# Patient Record
Sex: Female | Born: 1975 | Race: White | Hispanic: No | Marital: Married | Smoking: Never smoker
Health system: Southern US, Community
[De-identification: ages and names within clinical notes are randomized; demographics above are authoritative.]

## PROBLEM LIST (undated history)

## (undated) DIAGNOSIS — O139 Gestational [pregnancy-induced] hypertension without significant proteinuria, unspecified trimester: Secondary | ICD-10-CM

## (undated) DIAGNOSIS — D62 Acute posthemorrhagic anemia: Secondary | ICD-10-CM

## (undated) DIAGNOSIS — F53 Postpartum depression: Secondary | ICD-10-CM

## (undated) DIAGNOSIS — Z98891 History of uterine scar from previous surgery: Secondary | ICD-10-CM

## (undated) DIAGNOSIS — O99345 Other mental disorders complicating the puerperium: Secondary | ICD-10-CM

## (undated) DIAGNOSIS — F329 Major depressive disorder, single episode, unspecified: Secondary | ICD-10-CM

## (undated) DIAGNOSIS — F32A Depression, unspecified: Secondary | ICD-10-CM

## (undated) DIAGNOSIS — F419 Anxiety disorder, unspecified: Secondary | ICD-10-CM

## (undated) HISTORY — PX: LASIK: SHX215

---

## 2002-01-29 ENCOUNTER — Other Ambulatory Visit: Admission: RE | Admit: 2002-01-29 | Discharge: 2002-01-29 | Payer: Self-pay | Admitting: Gynecology

## 2003-02-05 ENCOUNTER — Other Ambulatory Visit: Admission: RE | Admit: 2003-02-05 | Discharge: 2003-02-05 | Payer: Self-pay | Admitting: Gynecology

## 2004-02-15 ENCOUNTER — Other Ambulatory Visit: Admission: RE | Admit: 2004-02-15 | Discharge: 2004-02-15 | Payer: Self-pay | Admitting: Gynecology

## 2004-07-29 ENCOUNTER — Ambulatory Visit: Payer: Self-pay | Admitting: Family Medicine

## 2005-02-15 ENCOUNTER — Other Ambulatory Visit: Admission: RE | Admit: 2005-02-15 | Discharge: 2005-02-15 | Payer: Self-pay | Admitting: Gynecology

## 2006-02-16 ENCOUNTER — Other Ambulatory Visit: Admission: RE | Admit: 2006-02-16 | Discharge: 2006-02-16 | Payer: Self-pay | Admitting: Obstetrics and Gynecology

## 2006-10-03 DIAGNOSIS — F329 Major depressive disorder, single episode, unspecified: Secondary | ICD-10-CM | POA: Insufficient documentation

## 2007-02-22 ENCOUNTER — Other Ambulatory Visit: Admission: RE | Admit: 2007-02-22 | Discharge: 2007-02-22 | Payer: Self-pay | Admitting: Obstetrics and Gynecology

## 2007-07-09 ENCOUNTER — Ambulatory Visit: Payer: Self-pay | Admitting: Family Medicine

## 2007-07-09 DIAGNOSIS — J029 Acute pharyngitis, unspecified: Secondary | ICD-10-CM | POA: Insufficient documentation

## 2007-07-09 LAB — CONVERTED CEMR LAB: Rapid Strep: NEGATIVE

## 2008-05-26 ENCOUNTER — Ambulatory Visit (HOSPITAL_COMMUNITY): Admission: RE | Admit: 2008-05-26 | Discharge: 2008-05-26 | Payer: Self-pay | Admitting: Obstetrics

## 2008-07-07 ENCOUNTER — Ambulatory Visit (HOSPITAL_COMMUNITY): Admission: RE | Admit: 2008-07-07 | Discharge: 2008-07-07 | Payer: Self-pay | Admitting: Obstetrics

## 2008-08-25 ENCOUNTER — Ambulatory Visit (HOSPITAL_COMMUNITY): Admission: RE | Admit: 2008-08-25 | Discharge: 2008-08-25 | Payer: Self-pay | Admitting: Obstetrics

## 2008-10-07 ENCOUNTER — Ambulatory Visit: Payer: Self-pay | Admitting: Obstetrics & Gynecology

## 2008-10-07 ENCOUNTER — Inpatient Hospital Stay (HOSPITAL_COMMUNITY): Admission: AD | Admit: 2008-10-07 | Discharge: 2008-10-11 | Payer: Self-pay | Admitting: Obstetrics

## 2009-06-20 IMAGING — US US OB DETAIL+14 WK
1 series · 18 of 28 positions shown · non-contrast
Comparison: none

OBSTETRICAL ULTRASOUND:
 This ultrasound was performed in The [HOSPITAL], and the [facility] report will be stored to [REDACTED] PACS.

[Series 1: us ob detail+14 wk · 112 acquisitions, 18 frames shown]
[im 1/112]
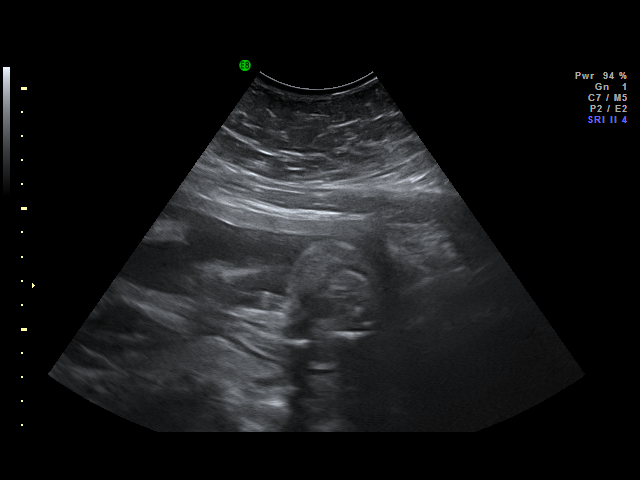
[im 9/112]
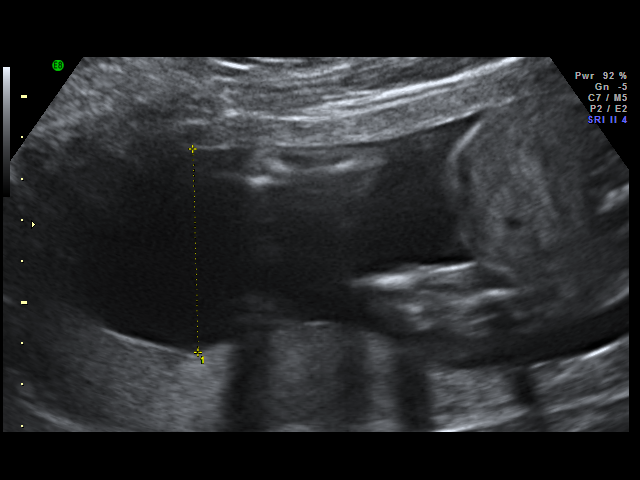
[im 13/112]
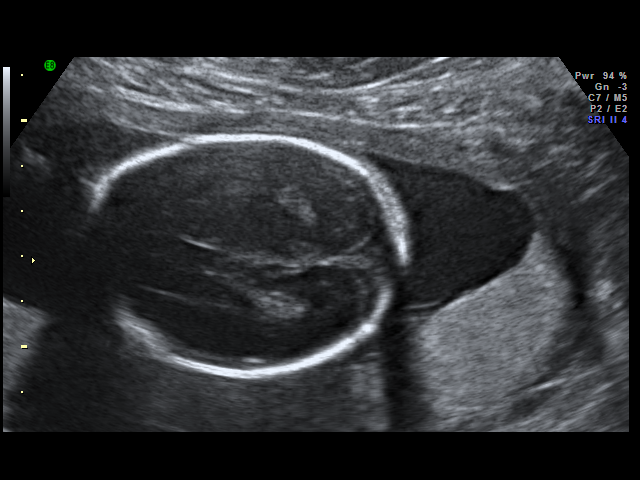
[im 21/112]
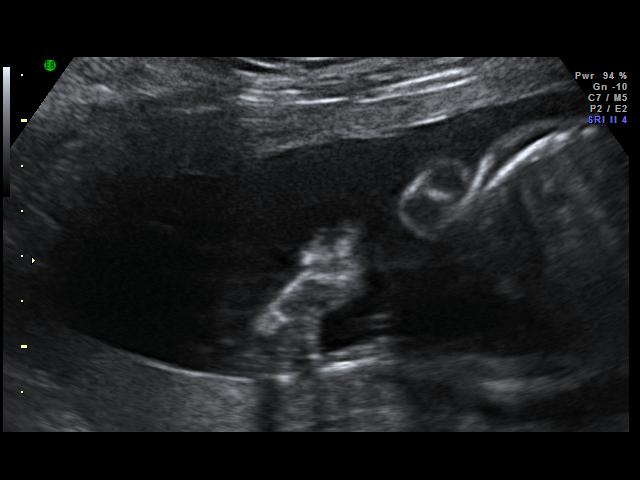
[im 29/112]
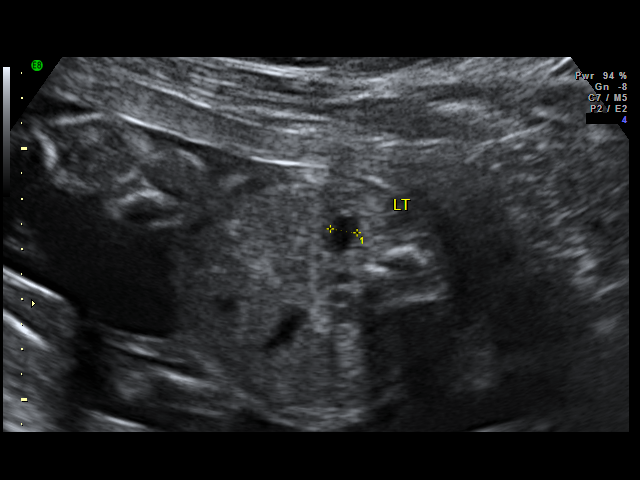
[im 33/112]
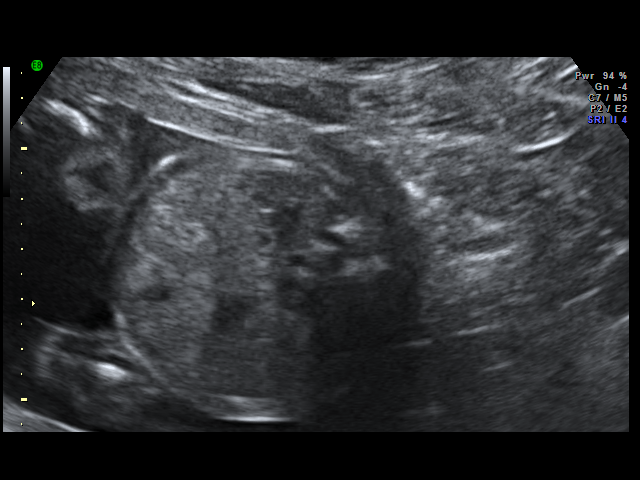
[im 42/112]
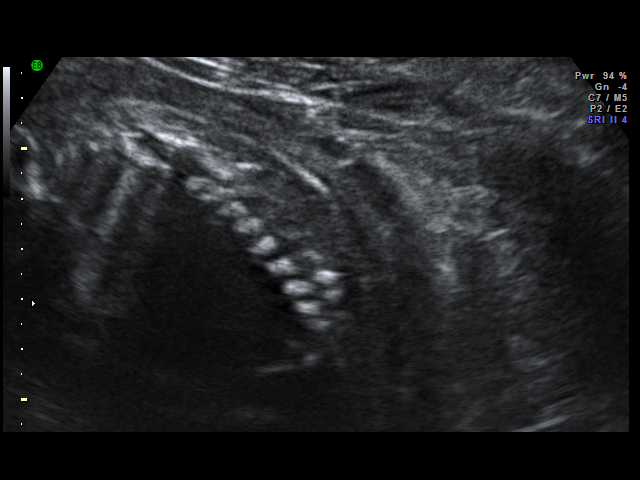
[im 46/112]
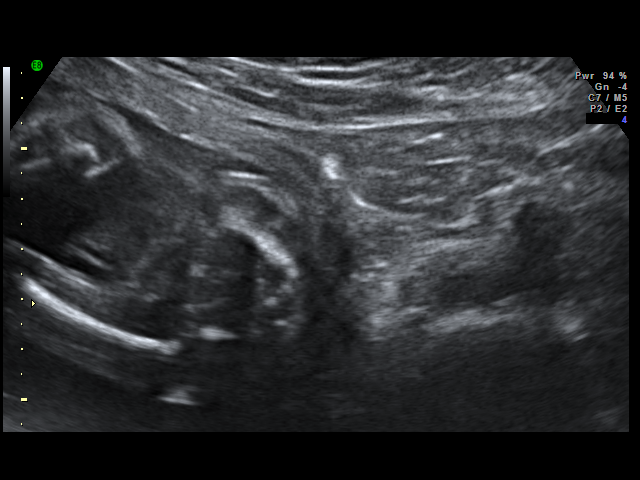
[im 54/112]
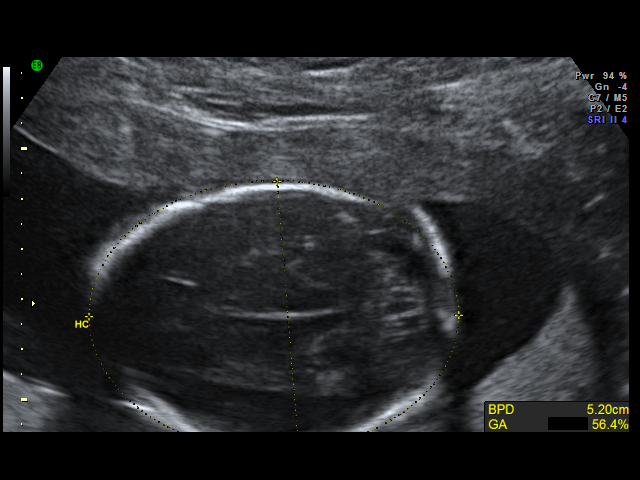
[im 58/112]
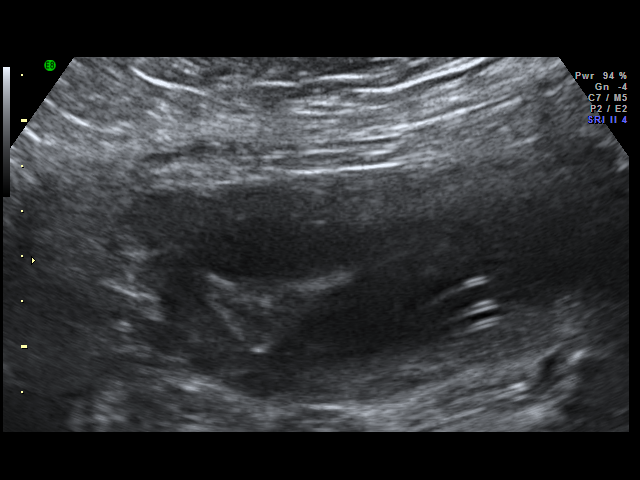
[im 66/112]
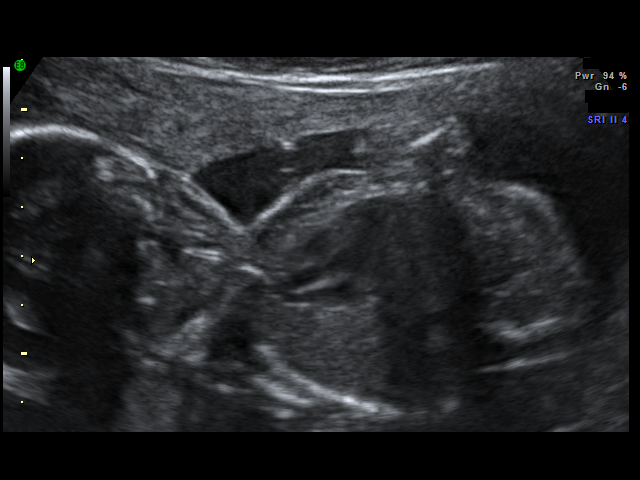
[im 70/112]
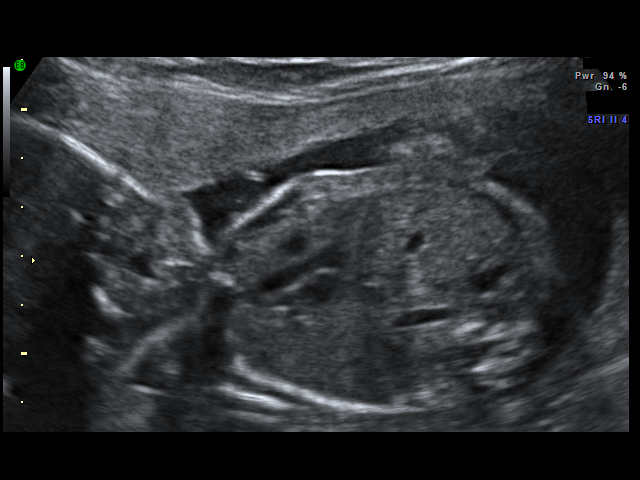
[im 79/112]
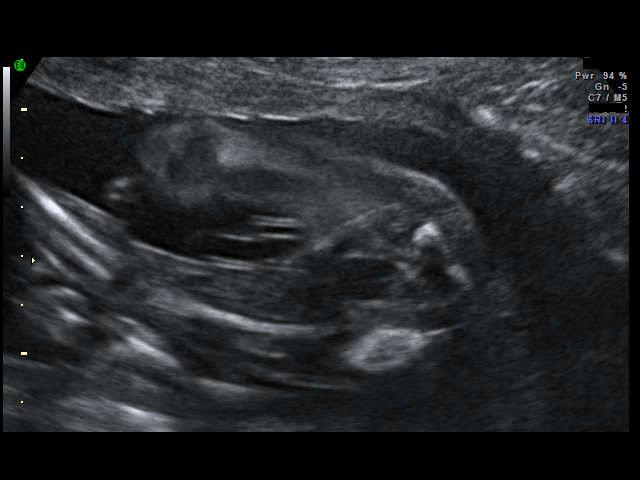
[im 87/112]
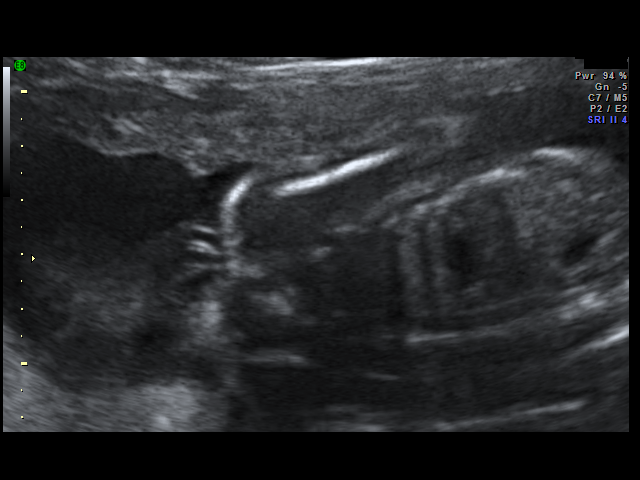
[im 91/112]
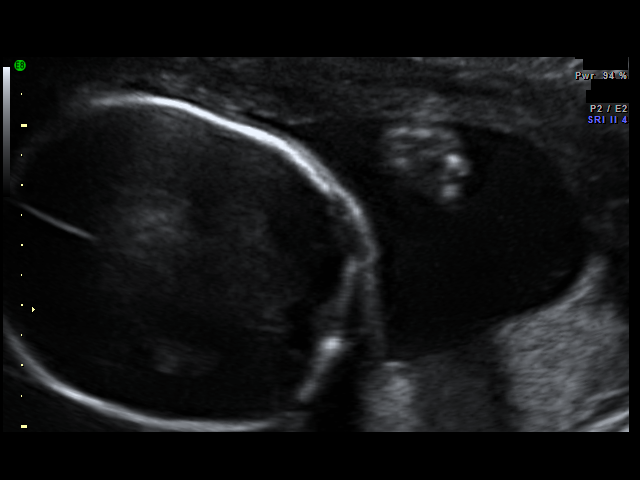
[im 99/112]
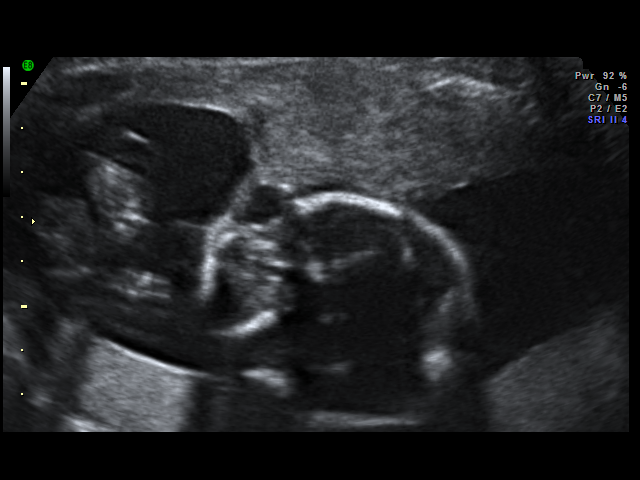
[im 103/112]
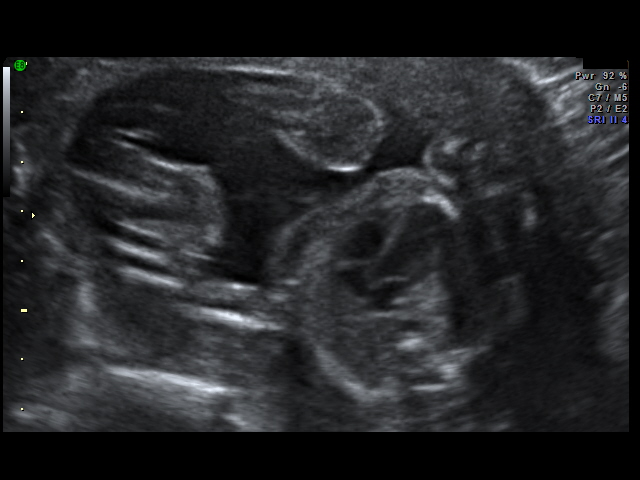
[im 112/112]
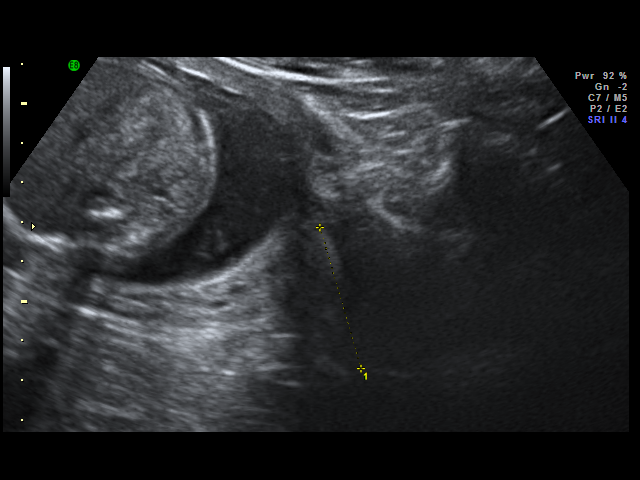

[18 of 28 positions shown; findings below may reference images not displayed]

IMPRESSION: [facility] has also been faxed to the ordering physician.

## 2009-09-19 IMAGING — US US OB FOLLOW-UP
1 series · 18 of 28 positions shown · non-contrast
Comparison: none

OBSTETRICAL ULTRASOUND:
 This ultrasound was performed in The [HOSPITAL], and the [facility] report will be stored to [REDACTED] PACS.

[Series 1: us ob follow-up · 37 acquisitions, 18 frames shown]
[im 1/37]
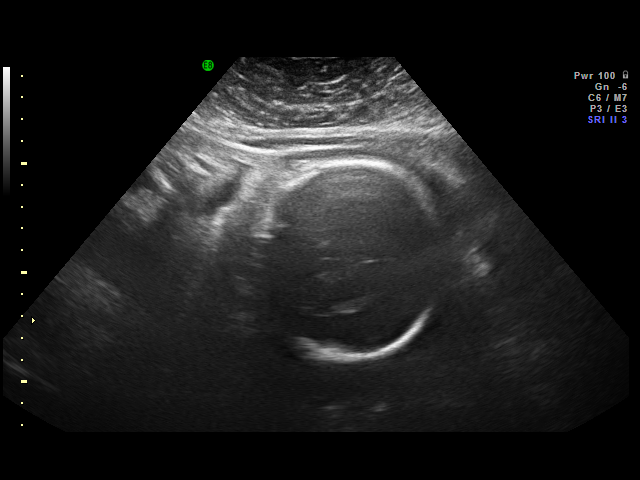
[im 3/37]
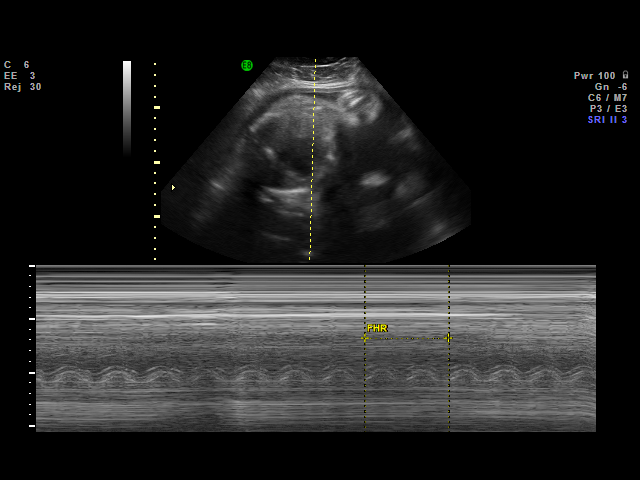
[im 5/37]
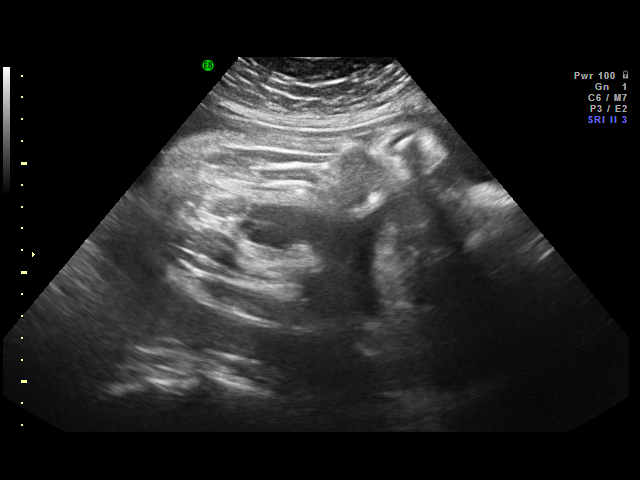
[im 7/37]
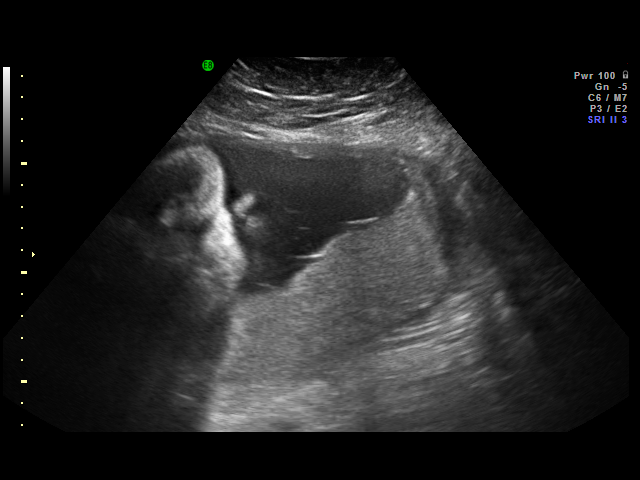
[im 10/37]
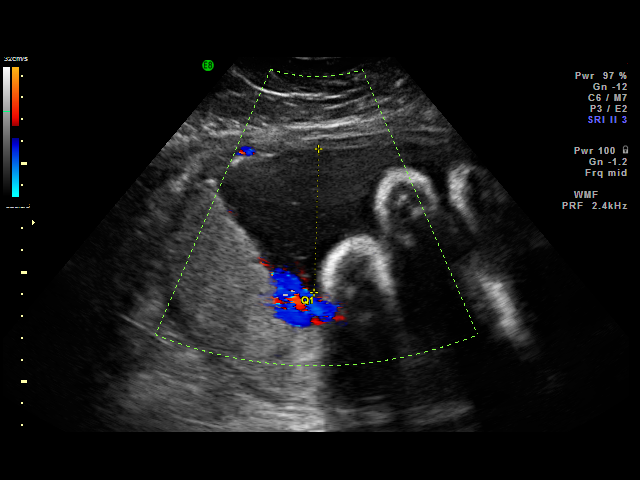
[im 11/37]
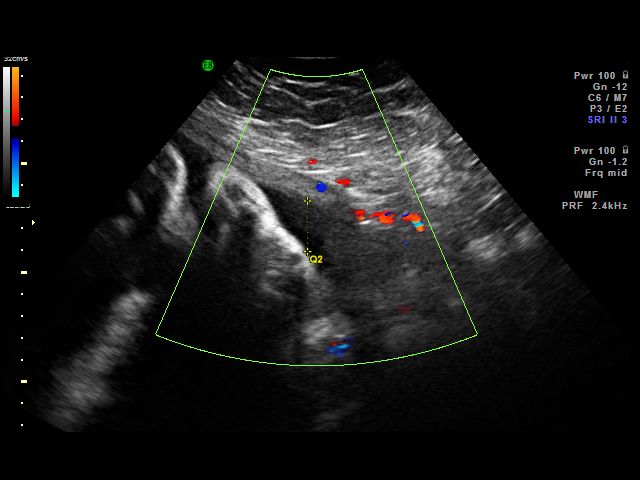
[im 14/37]
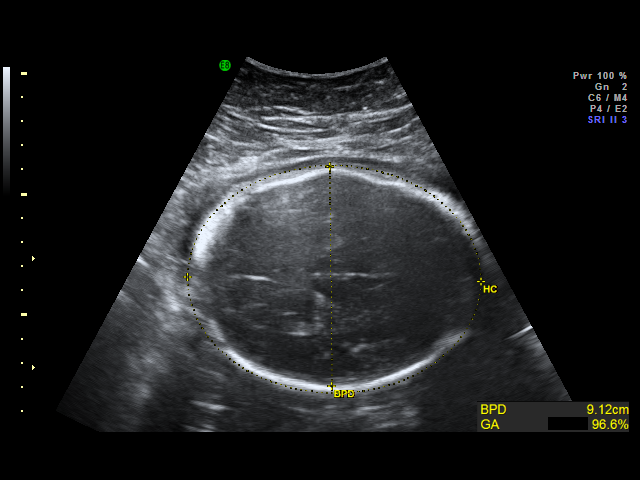
[im 15/37]
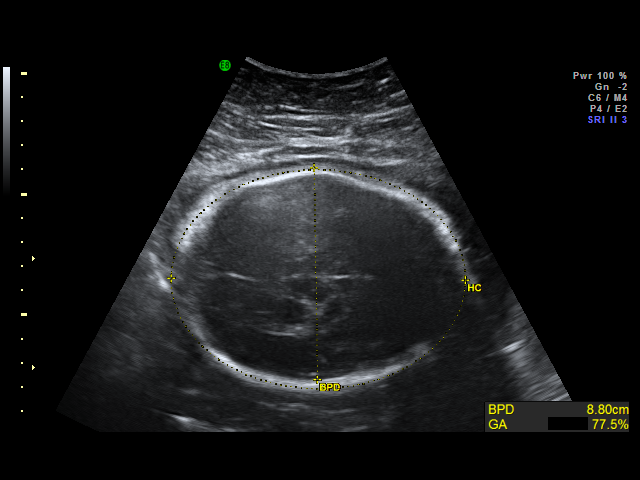
[im 18/37]
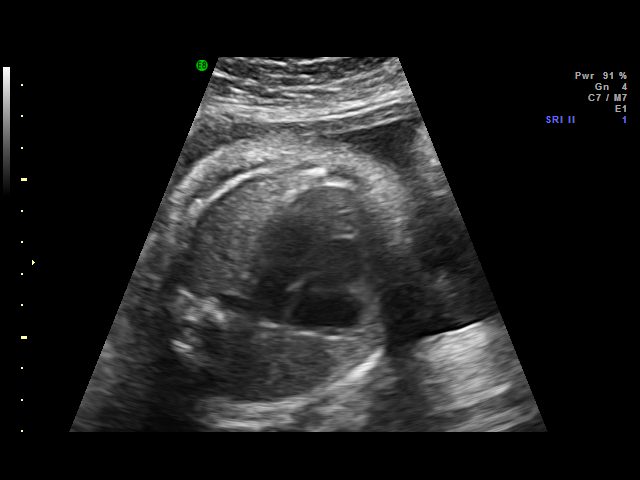
[im 19/37]
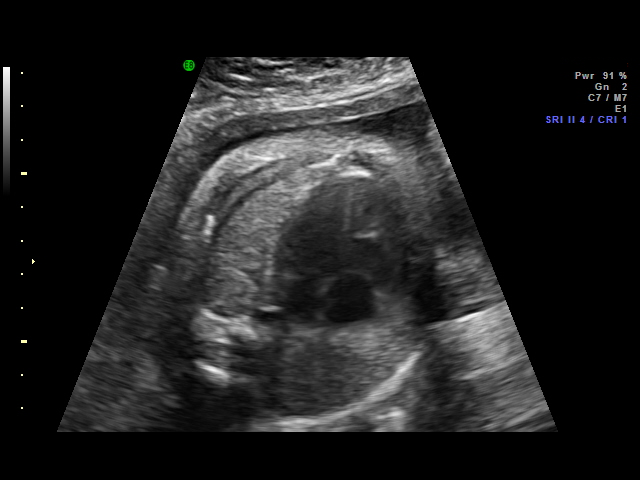
[im 22/37]
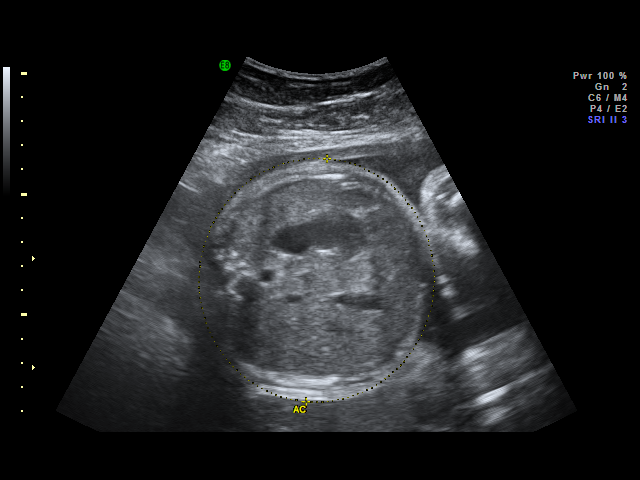
[im 23/37]
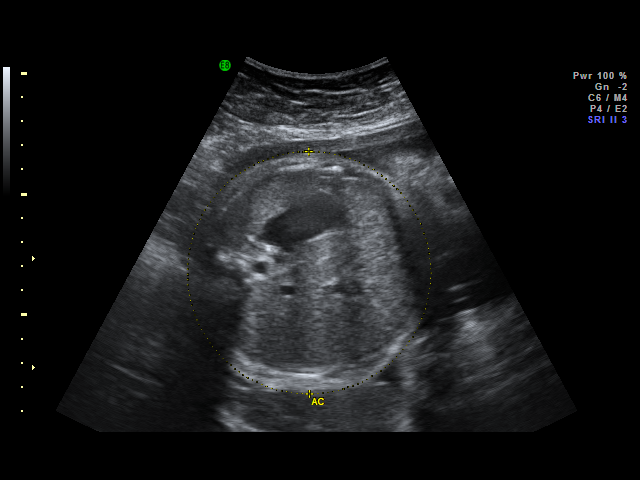
[im 26/37]
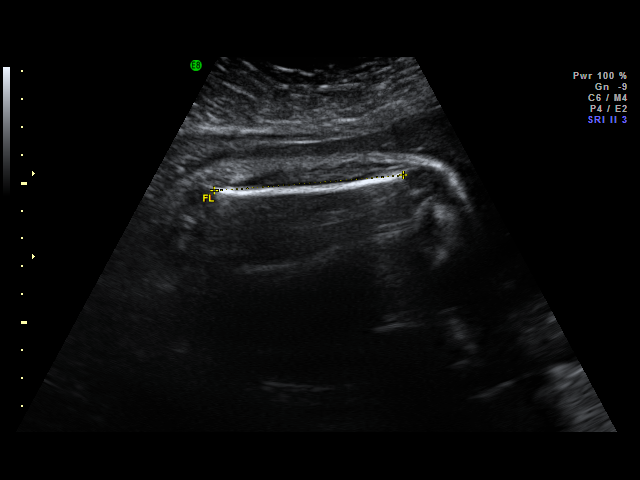
[im 29/37]
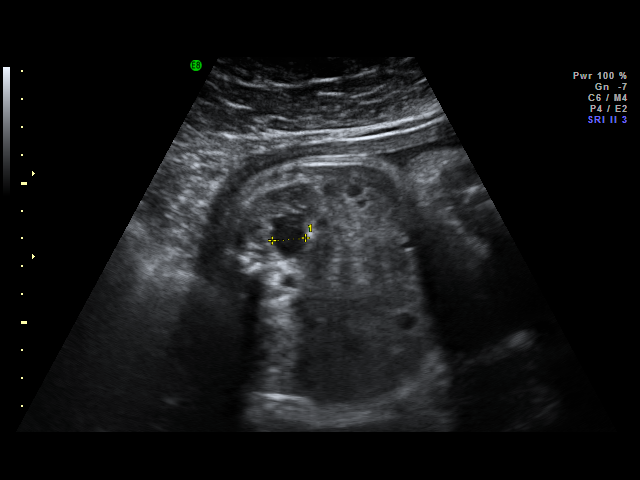
[im 30/37]
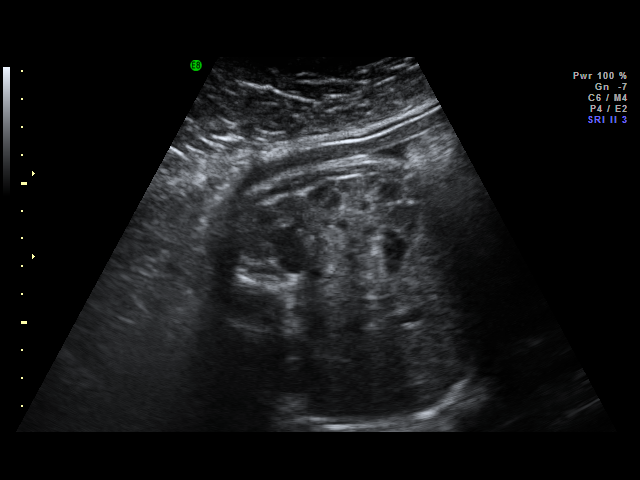
[im 33/37]
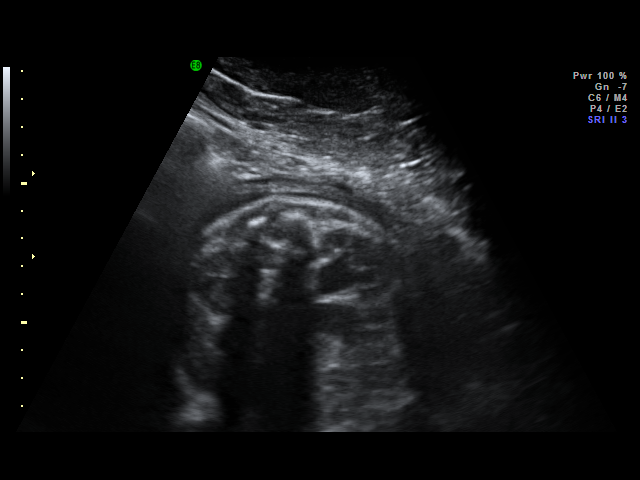
[im 34/37]
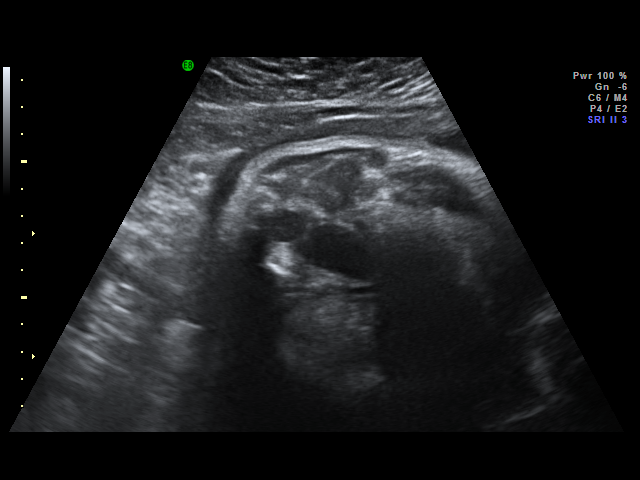
[im 37/37]
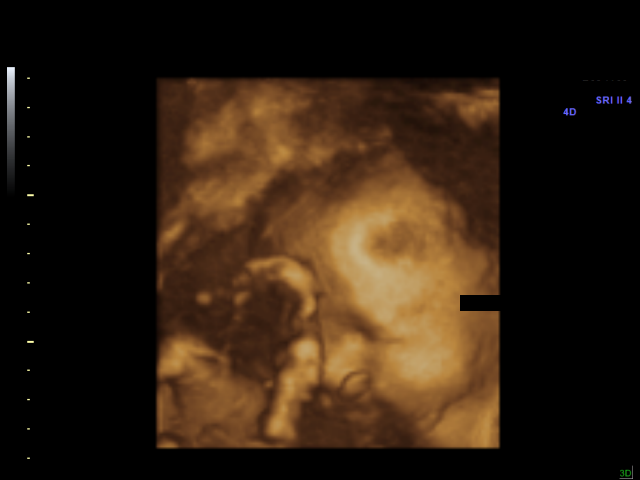

[18 of 28 positions shown; findings below may reference images not displayed]

IMPRESSION: [facility] has also been faxed to the ordering physician.

## 2009-12-14 ENCOUNTER — Ambulatory Visit: Payer: Self-pay | Admitting: Family Medicine

## 2009-12-14 DIAGNOSIS — J45991 Cough variant asthma: Secondary | ICD-10-CM | POA: Insufficient documentation

## 2009-12-31 ENCOUNTER — Ambulatory Visit: Payer: Self-pay | Admitting: Family Medicine

## 2009-12-31 DIAGNOSIS — K219 Gastro-esophageal reflux disease without esophagitis: Secondary | ICD-10-CM | POA: Insufficient documentation

## 2010-01-06 ENCOUNTER — Ambulatory Visit: Payer: Self-pay | Admitting: Family Medicine

## 2010-02-14 ENCOUNTER — Encounter: Payer: Self-pay | Admitting: Obstetrics

## 2010-02-22 NOTE — Assessment & Plan Note (Signed)
Summary: COUGH//SLM   Vital Signs:  Patient profile:   35 year old female Weight:      231 pounds Temp:     98.3 degrees F oral BP sitting:   130 / 90  (left arm) Cuff size:   regular  Vitals Entered By: Kern Reap CMA Duncan Dull) (December 14, 2009 12:19 PM) CC: cough   CC:  cough.  History of Present Illness: [name] [name] is a 35 year old, married female, nonsmoker, who comes in today with a 6 week history of coughing.  She does have a history of very mild allergic rhinitis.  No history of asthma in the past.  No new environmental triggers.  She has a 33-month-old child who currently is on albuterol and steroids.  Because of asthma  Allergies: No Known Drug Allergies  Family History: Reviewed history from 10/03/2006 and no changes required. Family History High cholesterol Family History Hypertension Family History Weight disorder Family History of Arthritis Family History Lung cancer Family History Ovarian cancer Family History Psychiatric care Family History of Stroke M 1st degree relative <50 Family History Uterine cancer Family History of Cardiovascular disorder  Social History: Reviewed history from 10/03/2006 and no changes required. Occupation: Married Never Smoked Alcohol use-yes Drug use-no Regular exercise-no  Review of Systems      See HPI  Physical Exam  General:  Well-developed,well-nourished,in no acute distress; alert,appropriate and cooperative throughout examination Head:  Normocephalic and atraumatic without obvious abnormalities. No apparent alopecia or balding. Eyes:  No corneal or conjunctival inflammation noted. EOMI. Perrla. Funduscopic exam benign, without hemorrhages, exudates or papilledema. Vision grossly normal. Ears:  External ear exam shows no significant lesions or deformities.  Otoscopic examination reveals clear canals, tympanic membranes are intact bilaterally without bulging, retraction, inflammation or discharge. Hearing is grossly  normal bilaterally. Nose:  External nasal examination shows no deformity or inflammation. Nasal mucosa are pink and moist without lesions or exudates. Mouth:  Oral mucosa and oropharynx without lesions or exudates.  Teeth in good repair. Neck:  No deformities, masses, or tenderness noted. Chest Wall:  No deformities, masses, or tenderness noted. Lungs:  symmetrical breath sounds  expiratory wheezing on forced expiration   Problems:  Medical Problems Added: 1)  Dx of Asthma, Cough Variant  (ICD-493.82)  Impression & Recommendations:  Problem # 1:  ASTHMA, COUGH VARIANT (JYN-829.56) Assessment New  Her updated medication list for this problem includes:    Prednisone 20 Mg Tabs (Prednisone) ..... Uad  Complete Medication List: 1)  Yasmin 28 3-0.03 Mg Tabs (Drospirenone-ethinyl estradiol) 2)  Celexa 20 Mg Tabs (Citalopram hydrobromide) 3)  Hydromet 5-1.5 Mg/23ml Syrp (Hydrocodone-homatropine) .Marland Kitchen.. 1 or 2 tspsqhs as needed 4)  Prednisone 20 Mg Tabs (Prednisone) .... Uad 5)  Hydromet 5-1.5 Mg/71ml Syrp (Hydrocodone-homatropine) .... 1/2 to 1 tsp  qhs  as needed  Patient Instructions: 1)  drink 30 ounces of water daily. 2)  Vaporizer in her bedroom at night. 3)  Prednisone two tabs x 3 days, one x 3 days, a half x 3 days, then half a tablet Monday, Wednesday, Friday, for a two week taper. 4)  Hydromet one half to 1 teaspoon at bedtime as needed for nighttime cough. 5)  Return p.r.n. 6)  While on the prednisone stay on a complete salt free diet Prescriptions: HYDROMET 5-1.5 MG/5ML SYRP (HYDROCODONE-HOMATROPINE) 1/2 to 1 tsp  qhs  as needed  #4oz x 1   Entered and Authorized by:   Roderick Pee MD   Signed by:   Tinnie Gens  Shawnie Dapper MD on 12/14/2009   Method used:   Print then Give to Patient   RxID:   [id number] PREDNISONE 20 MG TABS (PREDNISONE) UAD  #30 x 1   Entered and Authorized by:   Roderick Pee MD   Signed by:   Roderick Pee MD on 12/14/2009   Method used:   Print then  Give to Patient   RxID:   [id number]    Orders Added: 1)  Est. Patient Level IV [28413]

## 2010-02-22 NOTE — Assessment & Plan Note (Signed)
Summary: continues with cough/dm   Vital Signs:  Patient profile:   35 year old female Height:      67 inches Weight:      233 pounds BMI:     36.62 Temp:     98.4 degrees F oral BP sitting:   120 / 80  (left arm) Cuff size:   regular  Vitals Entered By: Kern Reap CMA Duncan Dull) (December 31, 2009 1:16 PM) CC: continued cough and rash under breast   CC:  continued cough and rash under breast.  History of Present Illness: [name] [name] is a 35 year old, married female, nonsmoker, who comes in today with a persistent cough.  We saw HER-2 weeks ago for evaluation of this problem.  Exam was negative, except she was wheezing.  We start her on a short course of prednisone 40 mg a day for 3 days with a taper.  However, she took 40 mg a day for 8 days and then begin a taper.  She is now down to one tablet a day.  However, she still coughing and wheezing.  No fever no sputum production.  Review of systems negative except as discussed symptoms of reflux esophagitis with burning in her upper esophagus.  She also states on occasion.  She feels like food or pills get stuck in her upper esophagus.  Current Medications (verified): 1)  Yasmin 28 3-0.03 Mg  Tabs (Drospirenone-Ethinyl Estradiol) 2)  Celexa 20 Mg  Tabs (Citalopram Hydrobromide) 3)  Prednisone 20 Mg Tabs (Prednisone) .... Uad 4)  Hydromet 5-1.5 Mg/29ml Syrp (Hydrocodone-Homatropine) .... 1/2 To 1 Tsp  Qhs  As Needed  Allergies (verified): No Known Drug Allergies  Past History:  Past medical, surgical, family and social histories (including risk factors) reviewed, and no changes noted (except as noted below).  Past Medical History: Reviewed history from 10/03/2006 and no changes required. Depression  Past Surgical History: Reviewed history from 10/03/2006 and no changes required. Denies surgical history  Family History: Reviewed history from 10/03/2006 and no changes required. Family History High cholesterol Family History  Hypertension Family History Weight disorder Family History of Arthritis Family History Lung cancer Family History Ovarian cancer Family History Psychiatric care Family History of Stroke M 1st degree relative <50 Family History Uterine cancer Family History of Cardiovascular disorder  Social History: Reviewed history from 10/03/2006 and no changes required. Occupation: Married Never Smoked Alcohol use-yes Drug use-no Regular exercise-no  Review of Systems      See HPI  Physical Exam  General:  Well-developed,well-nourished,in no acute distress; alert,appropriate and cooperative throughout examination Head:  Normocephalic and atraumatic without obvious abnormalities. No apparent alopecia or balding. Eyes:  No corneal or conjunctival inflammation noted. EOMI. Perrla. Funduscopic exam benign, without hemorrhages, exudates or papilledema. Vision grossly normal. Ears:  External ear exam shows no significant lesions or deformities.  Otoscopic examination reveals clear canals, tympanic membranes are intact bilaterally without bulging, retraction, inflammation or discharge. Hearing is grossly normal bilaterally. Nose:  External nasal examination shows no deformity or inflammation. Nasal mucosa are pink and moist without lesions or exudates. Mouth:  Oral mucosa and oropharynx without lesions or exudates.  Teeth in good repair. Neck:  No deformities, masses, or tenderness noted. Chest Wall:  No deformities, masses, or tenderness noted. Lungs:  symmetrical breath sounds bilateral wheezing   Problems:  Medical Problems Added: 1)  Dx of Gerd  (ICD-530.81)  Impression & Recommendations:  Problem # 1:  GERD (ICD-530.81) Assessment New  Problem # 2:  ASTHMA, COUGH  VARIANT (ICD-493.82) Assessment: Unchanged  Her updated medication list for this problem includes:    Prednisone 20 Mg Tabs (Prednisone) ..... Uad    Proair Hfa 108 (90 Base) Mcg/act Aers (Albuterol sulfate) .Marland Kitchen... 2 puffs 3  x day  Complete Medication List: 1)  Yasmin 28 3-0.03 Mg Tabs (Drospirenone-ethinyl estradiol) 2)  Celexa 20 Mg Tabs (Citalopram hydrobromide) 3)  Prednisone 20 Mg Tabs (Prednisone) .... Uad 4)  Hydromet 5-1.5 Mg/39ml Syrp (Hydrocodone-homatropine) .... 1/2 to 1 tsp  qhs  as needed 5)  Doxycycline Hyclate 100 Mg Caps (Doxycycline hyclate) .... Take 1 tablet by mouth two times a day 6)  Proair Hfa 108 (90 Base) Mcg/act Aers (Albuterol sulfate) .... 2 puffs 3 x day  Patient Instructions: 1)  take doxycycline 100 mg twice daily. 2)  Drink 30 ounces of water daily. 3)  Try taking half of a teaspoon of Hydromet 3 times a day. 4)  Get OTC Prilosec 20 mg and take one twice daily, also, nothing to eat or drink for 3 hours before you go to bed at night......... and avoid caffeine, and peppermint. 5)  Prednisone two tabs daily. 6)  Albuterol 2 puffs ,,,,,,,,,, 3 times a day 7)  remember the environmental things.  We discussed 8)  Return next Thursday for follow-up Prescriptions: PROAIR HFA 108 (90 BASE) MCG/ACT AERS (ALBUTEROL SULFATE) 2 puffs 3 x day  #1 x 1   Entered and Authorized by:   Roderick Pee MD   Signed by:   Roderick Pee MD on 12/31/2009   Method used:   Print then Give to Patient   RxID:   [id number] HYDROMET 5-1.5 MG/5ML SYRP (HYDROCODONE-HOMATROPINE) 1/2 to 1 tsp  qhs  as needed  #8oz x 1   Entered and Authorized by:   Roderick Pee MD   Signed by:   Roderick Pee MD on 12/31/2009   Method used:   Print then Give to Patient   RxID:   [id number] DOXYCYCLINE HYCLATE 100 MG CAPS (DOXYCYCLINE HYCLATE) Take 1 tablet by mouth two times a day  #20 x 1   Entered and Authorized by:   Roderick Pee MD   Signed by:   Roderick Pee MD on 12/31/2009   Method used:   Print then Give to Patient   RxID:   [id number]    Orders Added: 1)  Est. Patient Level IV [01027]    Prevention & Chronic Care Immunizations   Influenza vaccine: Not documented    Tetanus  booster: Not documented    Pneumococcal vaccine: Not documented  Other Screening   Pap smear: Not documented   Smoking status: never  (10/03/2006)

## 2010-02-24 NOTE — Assessment & Plan Note (Signed)
Summary: 4 DAY FUP PER DR Rayden Scheper//CCM   Vital Signs:  Patient profile:   35 year old female Height:      67 inches Weight:      233 pounds Temp:     98.7 degrees F oral Pulse rate:   118 / minute BP sitting:   120 / 92  (left arm) Cuff size:   large  Vitals Entered By: Kathlene November LPN (January 06, 2010 4:54 PM) CC: follow-up cough. Pt states cough has improved but still present   CC:  follow-up cough. Pt states cough has improved but still present.  History of Present Illness: Billiejean is a 35 year old, married female, nonsmoker, who comes back today for follow-up of reactive airway disease.  She states she feels much better.  Her cough is markedly diminished.  No side effects from medication  Current Medications (verified): 1)  Yasmin 28 3-0.03 Mg  Tabs (Drospirenone-Ethinyl Estradiol) 2)  Celexa 20 Mg  Tabs (Citalopram Hydrobromide) 3)  Prednisone 20 Mg Tabs (Prednisone) .... Uad 4)  Hydromet 5-1.5 Mg/47ml Syrp (Hydrocodone-Homatropine) .... 1/2 To 1 Tsp  Qhs  As Needed 5)  Doxycycline Hyclate 100 Mg Caps (Doxycycline Hyclate) .... Take 1 Tablet By Mouth Two Times A Day 6)  Proair Hfa 108 (90 Base) Mcg/act Aers (Albuterol Sulfate) .... 2 Puffs 3 X Day  Allergies (verified): No Known Drug Allergies  Comments:  Nurse/Medical Assistant: The patient's medications and allergies were reviewed with the patient and were updated in the Medication and Allergy Lists. Kathlene November LPN (January 06, 2010 4:55 PM)  Past History:  Past medical, surgical, family and social histories (including risk factors) reviewed for relevance to current acute and chronic problems.  Past Medical History: Reviewed history from 10/03/2006 and no changes required. Depression  Past Surgical History: Reviewed history from 10/03/2006 and no changes required. Denies surgical history  Family History: Reviewed history from 10/03/2006 and no changes required. Family History High cholesterol Family  History Hypertension Family History Weight disorder Family History of Arthritis Family History Lung cancer Family History Ovarian cancer Family History Psychiatric care Family History of Stroke M 1st degree relative <50 Family History Uterine cancer Family History of Cardiovascular disorder  Social History: Reviewed history from 10/03/2006 and no changes required. Occupation: Married Never Smoked Alcohol use-yes Drug use-no Regular exercise-no  Review of Systems      See HPI  Physical Exam  General:  Well-developed,well-nourished,in no acute distress; alert,appropriate and cooperative throughout examination Heart:  symmetrical breath sounds, very late to minor expiratory wheezing   Impression & Recommendations:  Problem # 1:  ASTHMA, COUGH VARIANT (ICD-493.82)  Her updated medication list for this problem includes:    Prednisone 20 Mg Tabs (Prednisone) ..... Uad    Proair Hfa 108 (90 Base) Mcg/act Aers (Albuterol sulfate) .Marland Kitchen... 2 puffs 3 x day  Complete Medication List: 1)  Yasmin 28 3-0.03 Mg Tabs (Drospirenone-ethinyl estradiol) 2)  Celexa 20 Mg Tabs (Citalopram hydrobromide) 3)  Prednisone 20 Mg Tabs (Prednisone) .... Uad 4)  Hydromet 5-1.5 Mg/41ml Syrp (Hydrocodone-homatropine) .... 1/2 to 1 tsp  qhs  as needed 5)  Doxycycline Hyclate 100 Mg Caps (Doxycycline hyclate) .... Take 1 tablet by mouth two times a day 6)  Proair Hfa 108 (90 Base) Mcg/act Aers (Albuterol sulfate) .... 2 puffs 3 x day  Patient Instructions: 1)  begin to taper the prednisone by taking one tablet daily for 3 days, half a tablet for 3 days, then half a tablet every other day  for two week taper.  Continue to take Hydromet and  at bedtime, and finished the doxycycline.  Two ahead and stop the albuterol.  Return p.r.n.   Orders Added: 1)  Est. Patient Level III [16109]

## 2010-04-29 LAB — CBC
HCT: 25.2 % — ABNORMAL LOW (ref 36.0–46.0)
HCT: 25.3 % — ABNORMAL LOW (ref 36.0–46.0)
HCT: 25.6 % — ABNORMAL LOW (ref 36.0–46.0)
HCT: 32.6 % — ABNORMAL LOW (ref 36.0–46.0)
HCT: 40 % (ref 36.0–46.0)
HCT: 39.4 % (ref 36.0–46.0)
Hemoglobin: 13.5 g/dL (ref 12.0–15.0)
Hemoglobin: 8.3 g/dL — ABNORMAL LOW (ref 12.0–15.0)
Hemoglobin: 8.8 g/dL — ABNORMAL LOW (ref 12.0–15.0)
Hemoglobin: 13.2 g/dL (ref 12.0–15.0)
MCHC: 32.9 g/dL (ref 30.0–36.0)
MCHC: 34.7 g/dL (ref 30.0–36.0)
MCV: 93.4 fL (ref 78.0–100.0)
MCV: 94.8 fL (ref 78.0–100.0)
MCV: 93.4 fL (ref 78.0–100.0)
Platelets: 130 10*3/uL — ABNORMAL LOW (ref 150–400)
Platelets: 141 10*3/uL — ABNORMAL LOW (ref 150–400)
Platelets: 143 10*3/uL — ABNORMAL LOW (ref 150–400)
RBC: 2.57 MIL/uL — ABNORMAL LOW (ref 3.87–5.11)
RBC: 2.65 MIL/uL — ABNORMAL LOW (ref 3.87–5.11)
RBC: 2.75 MIL/uL — ABNORMAL LOW (ref 3.87–5.11)
RDW: 14.1 % (ref 11.5–15.5)
RDW: 14.6 % (ref 11.5–15.5)
RDW: 14.1 % (ref 11.5–15.5)
WBC: 14 10*3/uL — ABNORMAL HIGH (ref 4.0–10.5)
WBC: 14.5 10*3/uL — ABNORMAL HIGH (ref 4.0–10.5)
WBC: 17.8 10*3/uL — ABNORMAL HIGH (ref 4.0–10.5)
WBC: 18 10*3/uL — ABNORMAL HIGH (ref 4.0–10.5)
WBC: 19.3 10*3/uL — ABNORMAL HIGH (ref 4.0–10.5)
WBC: 9.7 10*3/uL (ref 4.0–10.5)

## 2010-04-29 LAB — COMPREHENSIVE METABOLIC PANEL
Alkaline Phosphatase: 185 U/L — ABNORMAL HIGH (ref 39–117)
BUN: 10 mg/dL (ref 6–23)
CO2: 22 mEq/L (ref 19–32)
GFR calc non Af Amer: >60 mL/min (ref >60)
Glucose, Bld: 112 mg/dL — ABNORMAL HIGH (ref 70–99)
Potassium: 4.1 mEq/L (ref 3.5–5.1)
Total Protein: 5.7 g/dL — ABNORMAL LOW (ref 6.0–8.3)

## 2010-04-29 LAB — ABO/RH: ABO/RH(D): O POS

## 2010-04-29 LAB — CROSSMATCH

## 2010-04-29 LAB — URIC ACID: Uric Acid, Serum: 6.7 mg/dL (ref 2.4–7.0)

## 2010-04-29 LAB — LACTATE DEHYDROGENASE: LDH: 163 U/L (ref 94–250)

## 2010-12-21 ENCOUNTER — Ambulatory Visit (HOSPITAL_BASED_OUTPATIENT_CLINIC_OR_DEPARTMENT_OTHER): Payer: 59 | Attending: Obstetrics | Admitting: Radiology

## 2010-12-21 VITALS — Ht 67.0 in | Wt 220.0 lb

## 2010-12-21 DIAGNOSIS — Z79899 Other long term (current) drug therapy: Secondary | ICD-10-CM | POA: Insufficient documentation

## 2010-12-21 DIAGNOSIS — G4733 Obstructive sleep apnea (adult) (pediatric): Secondary | ICD-10-CM

## 2010-12-21 DIAGNOSIS — G4710 Hypersomnia, unspecified: Secondary | ICD-10-CM | POA: Insufficient documentation

## 2010-12-31 DIAGNOSIS — G4710 Hypersomnia, unspecified: Secondary | ICD-10-CM

## 2010-12-31 DIAGNOSIS — Z79899 Other long term (current) drug therapy: Secondary | ICD-10-CM

## 2010-12-31 DIAGNOSIS — G4730 Sleep apnea, unspecified: Secondary | ICD-10-CM

## 2010-12-31 NOTE — Procedures (Signed)
[name] [name], [name] [name]           ACCOUNT NO.:  [id number]  MEDICAL RECORD NO.:  [id number]          PATIENT TYPE:  OUT  LOCATION:  SLEEP CENTER                 FACILITY:  [facility]  PHYSICIAN:  Clinton D. Maple Hudson, MD, FCCP, FACPDATE OF BIRTH:  07/04/1977  DATE OF STUDY:  12/21/2010                           NOCTURNAL POLYSOMNOGRAM  REFERRING PHYSICIAN:  Lendon Colonel, MD  REFERRING PHYSICIAN:  Alphonsus Sias. Ernestina Penna, MD  INDICATION FOR STUDY:  Hypersomnia with sleep apnea.  EPWORTH SLEEPINESS:  Score 11/24.  BMI 34.5, weight 220 pounds, height 67 inches, neck 12.5 inches.  HOME MEDICATIONS:  Charted and reviewed.  SLEEP ARCHITECTURE:  Total sleep time 342.5 minutes with sleep efficiency 89.7%.  Stage I 2%, stage II 61.2%, stage III 26.6%, REM 10.2% of total sleep time.  Sleep latency 26.5 minutes, REM latency 256 minutes.  Awake after sleep onset 12.5 minutes, arousal index 25.6.  BEDTIME MEDICATION:  Zoloft.  RESPIRATORY DATA:  Apnea/hypopnea index (AHI) 1.6 per hour.  A total of 9 events were scored including 1 obstructive apnea and 8 hypopneas. Events were not positional.  REM AHI was 6.9 per hour.  There were insufficient numbers of events to qualify for application of split protocol, CPAP titration on this study night.  OXYGEN DATA:  Moderate snoring with oxygen desaturation to a nadir of 88% and a mean oxygen saturation through the study of 95.7% on room air. A total of 0.2 minutes was recorded through the total study with oxygen saturation less than 90% on room air.  CARDIAC DATA:  Normal sinus rhythm.  MOVEMENT/PARASOMNIA:  No significant movement disturbance.  No bathroom trips.  IMPRESSION/RECOMMENDATION: 1. Unremarkable sleep architecture for Sleep Center environment. 2. Occasional respiratory event with sleep disturbance, within normal     limits.  AHI 1.6 per hour (the normal range     for adults is between 0 and 5 events per hour).  Moderate snoring     with  oxygen desaturation to a nadir of 88% and a mean oxygen     saturation through the study of 95.7% on room air.     Clinton D. Maple Hudson, MD, [facility], FACP Diplomate, Biomedical engineer of Sleep Medicine Electronically Signed    CDY/MEDQ  D:  12/31/2010 09:02:08  T:  12/31/2010 09:37:11  Job:  [id number]

## 2011-02-28 ENCOUNTER — Encounter (HOSPITAL_BASED_OUTPATIENT_CLINIC_OR_DEPARTMENT_OTHER): Payer: Self-pay | Admitting: *Deleted

## 2011-02-28 NOTE — Pre-Procedure Instructions (Signed)
OK to do palatoplasty here, per Dr. Gelene Mink.

## 2011-03-03 ENCOUNTER — Other Ambulatory Visit: Payer: Self-pay | Admitting: Otolaryngology

## 2011-03-03 NOTE — H&P (Signed)
[name] [name],  [name] 36 y.o., female [id number]     Chief Complaint: Chronic Tonsil Swelling  HPI: Patient is a 36 year old female who presents for tonsil swelling.  She reports that problem occurs a few times per year and symptoms last about one week. Last occurrence was one year ago. Usually it begins with cold-like symptoms of sore throat and nasal congestion and rhinorrhea.  She does not seek medical therapy and has not received antibiotics for this problem. The right tonsil has been noticeably larger than the left for several years. She does not experience throat discomfort when not sick. With a URI, tonsils seem to enlarge with dysphagia, choking and trouble breathing. Current symptoms are improving and right tonsil seems to have reduced somewhat in size. Occasional problems with tonsil stones but they do not cause her much trouble and it has not happened recently. Reports some cough and nasal congestion; right ear feels full today and hearing slightly muffled. Patient denies chronic ear problems. She snores every night and this worsens when sick. Heartburn in the past but has not been an issue recently. Last year was diagnosed with atypical PNA. Sleep study performed 11/12; per report AHI was 1.6 per hour (moderate snoring with oxygen desaturation to a nadir of 88%).  She is taking prenatal vitamins but currently not pregnant.  PMH: Past Medical History  Diagnosis Date  . Depression   . Anxiety   . Tonsillar hypertrophy 02/2011    Surg Hx: Past Surgical History  Procedure Date  . Cesarean section 10/08/2008  . Lasik     FHx:  No family history on file. SocHx:  reports that she has never smoked. She has never used smokeless tobacco. She reports that she drinks alcohol. She reports that she does not use illicit drugs.  ALLERGIES:  Allergies  Allergen Reactions  . Adhesive (Tape) Other (See Comments)    BLISTERS    Medications Prior to Admission  Medication Sig Dispense Refill    . Multiple Vitamin (MULTIVITAMIN) tablet Take 1 tablet by mouth daily.      . sertraline (ZOLOFT) 50 MG tablet Take 50 mg by mouth daily. PM       No current facility-administered medications on file as of 03/03/2011.    No results found for this or any previous visit (from the past 48 hour(s)). No results found.  ROS:Systemic: Not feeling tired (fatigue).  No fever. Head: No headache. Otolaryngeal: The ears feel full  the right ear feels full.  No purulent nasal discharge.  Nasal passage blockage  and snoring.  No hoarseness.  Sore throat. Pulmonary: Cough. Neurological: No dizziness.  Last menstrual period 02/02/2011.  PHYSICAL EXAM: APPEARANCE: Well developed, overweight, in no acute distress.  Normal affect, in a pleasant mood.  Oriented to time, place and person. COMMUNICATION: Normal voice   HEAD & FACE:  No scars, lesions or masses of head and face.  Sinuses nontender to palpation.  Salivary glands without mass or tenderness.  Facial strength symmetric.   EYES: EOMI with normal primary gaze alignment. Visual acuity grossly intact.  PERRLA EXTERNAL EAR & NOSE: No scars, lesions or masses  EAC & TYMPANIC MEMBRANE:  EAC shows no obstructing lesions or debris. Right sided serous middle ear effusion. Left TM normal and intact. No evidence of infection or cholesteatoma.  GROSS HEARING: Normal   INTRANASAL EXAM: No polyps or purulence.  NASOPHARYNX: Normal, without lesions. LIPS, TEETH & GUMS: No lip lesions, normal dentition and normal gums. ORAL CAVITY/OROPHARYNX:    Oral mucosa moist without lesion or asymmetry of the palate, tongue or posterior pharynx. Uvula somewhat elongated, makes contact with right tonsil. Right tonsil is 4+, left tonsil 3+, no cryptic debris or exudate. Superior pole of right tonsil is firm and larger than the rest of the tonsil. Oropharynx without erythema or exudate. NECK:  Supple without adenopathy or mass. THYROID:  No asymmetry, thyromegaly or nodule  palpated.  NEUROLOGIC:  No gross CN deficits. No nystagmus noted.   LUNGS:  Clear to auscultation HEART:  Regular rate and rhythm with no murmurs. ABD:  Soft, active EXT:  Symmetric, no config. NEURO:  Grossly intact and symmetric    Assessment/Plan . Snoring   (786.09) . Acute serous otitis media   (381.01) . Tonsillar hypertrophy   (474.11)   Based on history, patient has experienced recurrent sore throat with snoring and occasional problems with tonsiliths. Exam reveals tonsillar asymmetry with right tonsil larger than the left. The right tonsil is firm to palpation but is not concerning for malignancy such as lymphoma. Patient may certainly benefit for tonsillectomy for recurrent disease,  as well as uvulectomy to help her sleep disordered breathing. Recent sleep study did not reveal full OSA. Risks and benefits of surgery discussed along with post-operative management. All questions and concerns addressed. Consent obtained. For middle ear effusion, recommend frequent autoinflation, this should clear with time. Patient will look at her schedule and call back to schedule surgery.  Roxicet and Lortab liquid prescriptions written and given.  Advancement of diet and activity discussed.  Re check my office 1 week.   [name] [name] [name] 03/03/2011, 4:51 PM      

## 2011-03-06 ENCOUNTER — Ambulatory Visit (HOSPITAL_BASED_OUTPATIENT_CLINIC_OR_DEPARTMENT_OTHER): Payer: 59 | Admitting: Anesthesiology

## 2011-03-06 ENCOUNTER — Encounter (HOSPITAL_BASED_OUTPATIENT_CLINIC_OR_DEPARTMENT_OTHER): Payer: Self-pay

## 2011-03-06 ENCOUNTER — Other Ambulatory Visit: Payer: Self-pay | Admitting: Otolaryngology

## 2011-03-06 ENCOUNTER — Encounter (HOSPITAL_BASED_OUTPATIENT_CLINIC_OR_DEPARTMENT_OTHER): Payer: Self-pay | Admitting: Anesthesiology

## 2011-03-06 ENCOUNTER — Ambulatory Visit (HOSPITAL_BASED_OUTPATIENT_CLINIC_OR_DEPARTMENT_OTHER)
Admission: RE | Admit: 2011-03-06 | Discharge: 2011-03-06 | Disposition: A | Discharge status: Home / Self Care | Payer: 59 | Source: Ambulatory Visit | Attending: Otolaryngology | Admitting: Otolaryngology

## 2011-03-06 ENCOUNTER — Encounter (HOSPITAL_BASED_OUTPATIENT_CLINIC_OR_DEPARTMENT_OTHER)
Admission: RE | Disposition: A | Discharge status: Home / Self Care | Payer: Self-pay | Source: Ambulatory Visit | Attending: Otolaryngology

## 2011-03-06 DIAGNOSIS — J45909 Unspecified asthma, uncomplicated: Secondary | ICD-10-CM | POA: Insufficient documentation

## 2011-03-06 DIAGNOSIS — J3501 Chronic tonsillitis: Secondary | ICD-10-CM

## 2011-03-06 DIAGNOSIS — J029 Acute pharyngitis, unspecified: Secondary | ICD-10-CM

## 2011-03-06 DIAGNOSIS — K1370 Unspecified lesions of oral mucosa: Secondary | ICD-10-CM | POA: Insufficient documentation

## 2011-03-06 DIAGNOSIS — K219 Gastro-esophageal reflux disease without esophagitis: Secondary | ICD-10-CM | POA: Insufficient documentation

## 2011-03-06 HISTORY — PX: UVULECTOMY: SHX2631

## 2011-03-06 HISTORY — DX: Depression, unspecified: F32.A

## 2011-03-06 HISTORY — DX: Major depressive disorder, single episode, unspecified: F32.9

## 2011-03-06 HISTORY — DX: Anxiety disorder, unspecified: F41.9

## 2011-03-06 HISTORY — PX: TONSILLECTOMY: SHX5217

## 2011-03-06 LAB — POCT HEMOGLOBIN-HEMACUE: Hemoglobin: 13.4 g/dL (ref 12.0–15.0)

## 2011-03-06 SURGERY — TONSILLECTOMY
Anesthesia: General | Site: Throat | Wound class: Clean Contaminated

## 2011-03-06 MED ORDER — OXYCODONE HCL 5 MG/5ML PO SOLN: 5.0000 mg | ORAL | Status: DC | PRN

## 2011-03-06 MED ORDER — SODIUM CHLORIDE 0.9 % IV SOLN: 2.0000 g | Freq: Once | INTRAVENOUS | Status: DC

## 2011-03-06 MED ORDER — DEXAMETHASONE SODIUM PHOSPHATE 4 MG/ML IJ SOLN: 8.0000 mg | Freq: Once | INTRAMUSCULAR | Status: DC

## 2011-03-06 MED ORDER — ONDANSETRON HCL 4 MG/2ML IJ SOLN: 4.0000 mg | INTRAMUSCULAR | Status: DC | PRN

## 2011-03-06 MED ORDER — FENTANYL CITRATE 0.05 MG/ML IJ SOLN
INTRAMUSCULAR | Status: DC | PRN
  Administered 2011-03-06: 100 ug via INTRAVENOUS
  Administered 2011-03-06 (×2): 50 ug via INTRAVENOUS

## 2011-03-06 MED ORDER — LIDOCAINE-EPINEPHRINE 0.5-1:200000 % IJ SOLN
INTRAMUSCULAR | Status: DC | PRN
  Administered 2011-03-06: 10 mL

## 2011-03-06 MED ORDER — DEXAMETHASONE SODIUM PHOSPHATE 4 MG/ML IJ SOLN
INTRAMUSCULAR | Status: DC | PRN
  Administered 2011-03-06: 8 mg via INTRAVENOUS

## 2011-03-06 MED ORDER — ACETAMINOPHEN 10 MG/ML IV SOLN
1000.0000 mg | Freq: Once | INTRAVENOUS | Status: AC
  Administered 2011-03-06: 1000 mg via INTRAVENOUS

## 2011-03-06 MED ORDER — DROPERIDOL 2.5 MG/ML IJ SOLN
INTRAMUSCULAR | Status: DC | PRN
  Administered 2011-03-06: 0.625 mg via INTRAVENOUS

## 2011-03-06 MED ORDER — POVIDONE-IODINE 10 % EX SOLN
CUTANEOUS | Status: DC | PRN
  Administered 2011-03-06: 1 via TOPICAL

## 2011-03-06 MED ORDER — HYDROCODONE-ACETAMINOPHEN 7.5-500 MG/15ML PO SOLN
10.0000 mL | ORAL | Status: DC | PRN
  Administered 2011-03-06: 15 mL via ORAL

## 2011-03-06 MED ORDER — ONDANSETRON HCL 4 MG/2ML IJ SOLN
INTRAMUSCULAR | Status: DC | PRN
  Administered 2011-03-06: 4 mg via INTRAVENOUS

## 2011-03-06 MED ORDER — METOCLOPRAMIDE HCL 5 MG/ML IJ SOLN: 10.0000 mg | Freq: Once | INTRAMUSCULAR | Status: DC | PRN

## 2011-03-06 MED ORDER — FENTANYL CITRATE 0.05 MG/ML IJ SOLN
25.0000 ug | INTRAMUSCULAR | Status: DC | PRN
  Administered 2011-03-06: 50 ug via INTRAVENOUS

## 2011-03-06 MED ORDER — MIDAZOLAM HCL 2 MG/2ML IJ SOLN: 0.5000 mg | INTRAMUSCULAR | Status: DC | PRN

## 2011-03-06 MED ORDER — MIDAZOLAM HCL 5 MG/5ML IJ SOLN
INTRAMUSCULAR | Status: DC | PRN
  Administered 2011-03-06: 2 mg via INTRAVENOUS

## 2011-03-06 MED ORDER — PROPOFOL 10 MG/ML IV EMUL
INTRAVENOUS | Status: DC | PRN
  Administered 2011-03-06: 100 ug/kg/min via INTRAVENOUS

## 2011-03-06 MED ORDER — LIDOCAINE HCL (CARDIAC) 20 MG/ML IV SOLN
INTRAVENOUS | Status: DC | PRN
  Administered 2011-03-06: 60 mg via INTRAVENOUS

## 2011-03-06 MED ORDER — SUCCINYLCHOLINE CHLORIDE 20 MG/ML IJ SOLN
INTRAMUSCULAR | Status: DC | PRN
  Administered 2011-03-06: 100 mg via INTRAVENOUS

## 2011-03-06 MED ORDER — SODIUM CHLORIDE 0.9 % IV SOLN
INTRAVENOUS | Status: DC
  Administered 2011-03-06: 11:00:00 via INTRAVENOUS

## 2011-03-06 MED ORDER — LACTATED RINGERS IV SOLN
INTRAVENOUS | Status: DC
  Administered 2011-03-06 (×2): via INTRAVENOUS

## 2011-03-06 MED ORDER — PROPOFOL 10 MG/ML IV EMUL
INTRAVENOUS | Status: DC | PRN
  Administered 2011-03-06: 250 mg via INTRAVENOUS

## 2011-03-06 MED ORDER — MORPHINE SULFATE 4 MG/ML IJ SOLN: 0.0500 mg/kg | INTRAMUSCULAR | Status: DC | PRN

## 2011-03-06 MED ORDER — ONDANSETRON HCL 4 MG PO TABS: 4.0000 mg | ORAL_TABLET | ORAL | Status: DC | PRN

## 2011-03-06 MED ORDER — ONDANSETRON 4 MG PO TBDP: 4.0000 mg | ORAL_TABLET | Freq: Four times a day (QID) | ORAL | Status: AC | PRN

## 2011-03-06 SURGICAL SUPPLY — 34 items
CANISTER SUCTION 1200CC (MISCELLANEOUS) ×2 IMPLANT
CATH ROBINSON RED A/P 10FR (CATHETERS) ×1 IMPLANT
CLEANER CAUTERY TIP 5X5 PAD (MISCELLANEOUS) ×1 IMPLANT
CLOTH BEACON ORANGE TIMEOUT ST (SAFETY) ×2 IMPLANT
COAGULATOR SUCT SWTCH 10FR 6 (ELECTROSURGICAL) ×1 IMPLANT
COVER MAYO STAND STRL (DRAPES) ×2 IMPLANT
DECANTER SPIKE VIAL GLASS SM (MISCELLANEOUS) IMPLANT
ELECT COATED BLADE 2.86 ST (ELECTRODE) ×2 IMPLANT
ELECT REM PT RETURN 9FT ADLT (ELECTROSURGICAL) ×2
ELECT REM PT RETURN 9FT PED (ELECTROSURGICAL)
ELECTRODE REM PT RETRN 9FT PED (ELECTROSURGICAL) IMPLANT
ELECTRODE REM PT RTRN 9FT ADLT (ELECTROSURGICAL) IMPLANT
GAUZE SPONGE 4X4 12PLY STRL LF (GAUZE/BANDAGES/DRESSINGS) ×2 IMPLANT
GLOVE ECLIPSE 7.0 STRL STRAW (GLOVE) ×1 IMPLANT
GLOVE ECLIPSE 8.0 STRL XLNG CF (GLOVE) ×2 IMPLANT
GOWN PREVENTION PLUS XLARGE (GOWN DISPOSABLE) ×2 IMPLANT
GOWN PREVENTION PLUS XXLARGE (GOWN DISPOSABLE) ×2 IMPLANT
MARKER SKIN DUAL TIP RULER LAB (MISCELLANEOUS) ×2 IMPLANT
NDL SPNL 22GX3.5 QUINCKE BK (NEEDLE) ×1 IMPLANT
NEEDLE SPNL 22GX3.5 QUINCKE BK (NEEDLE) ×2 IMPLANT
NS IRRIG 1000ML POUR BTL (IV SOLUTION) ×2 IMPLANT
PAD CLEANER CAUTERY TIP 5X5 (MISCELLANEOUS) ×1
PENCIL FOOT CONTROL (ELECTRODE) ×2 IMPLANT
SHEET MEDIUM DRAPE 40X70 STRL (DRAPES) ×2 IMPLANT
SPONGE TONSIL 1 RF SGL (DISPOSABLE) IMPLANT
SPONGE TONSIL 1.25 RF SGL STRG (GAUZE/BANDAGES/DRESSINGS) IMPLANT
SUT VICRYL 4-0 PS2 18IN ABS (SUTURE) ×1 IMPLANT
SYR BULB 3OZ (MISCELLANEOUS) ×2 IMPLANT
SYR CONTROL 10ML LL (SYRINGE) ×2 IMPLANT
TOWEL OR 17X24 6PK STRL BLUE (TOWEL DISPOSABLE) ×2 IMPLANT
TUBE CONNECTING 20X1/4 (TUBING) ×2 IMPLANT
TUBE SALEM SUMP 12R W/ARV (TUBING) IMPLANT
TUBE SALEM SUMP 16 FR W/ARV (TUBING) ×1 IMPLANT
WATER STERILE IRR 1000ML POUR (IV SOLUTION) ×1 IMPLANT

## 2011-03-06 NOTE — H&P (View-Only) (Signed)
[name] [name], [place] 36 y.o., female [id number]     Chief Complaint: Chronic Tonsil Swelling  HPI: Patient is a 36 year old female who presents for tonsil swelling.  She reports that problem occurs a few times per year and symptoms last about one week. Last occurrence was one year ago. Usually it begins with cold-like symptoms of sore throat and nasal congestion and rhinorrhea.  She does not seek medical therapy and has not received antibiotics for this problem. The right tonsil has been noticeably larger than the left for several years. She does not experience throat discomfort when not sick. With a URI, tonsils seem to enlarge with dysphagia, choking and trouble breathing. Current symptoms are improving and right tonsil seems to have reduced somewhat in size. Occasional problems with tonsil stones but they do not cause her much trouble and it has not happened recently. Reports some cough and nasal congestion; right ear feels full today and hearing slightly muffled. Patient denies chronic ear problems. She snores every night and this worsens when sick. Heartburn in the past but has not been an issue recently. Last year was diagnosed with atypical PNA. Sleep study performed 11/12; per report AHI was 1.6 per hour (moderate snoring with oxygen desaturation to a nadir of 88%).  She is taking prenatal vitamins but currently not pregnant.  PMH: Past Medical History  Diagnosis Date  . Depression   . Anxiety   . Tonsillar hypertrophy 02/2011    Surg Hx: Past Surgical History  Procedure Date  . Cesarean section 10/08/2008  . Lasik     FHx:  No family history on file. SocHx:  reports that she has never smoked. She has never used smokeless tobacco. She reports that she drinks alcohol. She reports that she does not use illicit drugs.  ALLERGIES:  Allergies  Allergen Reactions  . Adhesive (Tape) Other (See Comments)    BLISTERS    Medications Prior to Admission  Medication Sig Dispense Refill    . Multiple Vitamin (MULTIVITAMIN) tablet Take 1 tablet by mouth daily.      . sertraline (ZOLOFT) 50 MG tablet Take 50 mg by mouth daily. PM       No current facility-administered medications on file as of 03/03/2011.    No results found for this or any previous visit (from the past 48 hour(s)). No results found.  WUJ:WJXBJYNW: Not feeling tired (fatigue).  No fever. Head: No headache. Otolaryngeal: The ears feel full  the right ear feels full.  No purulent nasal discharge.  Nasal passage blockage  and snoring.  No hoarseness.  Sore throat. Pulmonary: Cough. Neurological: No dizziness.  Last menstrual period 02/02/2011.  PHYSICAL EXAM: APPEARANCE: Well developed, overweight, in no acute distress.  Normal affect, in a pleasant mood.  Oriented to time, place and person. COMMUNICATION: Normal voice   HEAD & FACE:  No scars, lesions or masses of head and face.  Sinuses nontender to palpation.  Salivary glands without mass or tenderness.  Facial strength symmetric.   EYES: EOMI with normal primary gaze alignment. Visual acuity grossly intact.  PERRLA EXTERNAL EAR & NOSE: No scars, lesions or masses  EAC & TYMPANIC MEMBRANE:  EAC shows no obstructing lesions or debris. Right sided serous middle ear effusion. Left TM normal and intact. No evidence of infection or cholesteatoma.  GROSS HEARING: Normal   INTRANASAL EXAM: No polyps or purulence.  NASOPHARYNX: Normal, without lesions. LIPS, TEETH & GUMS: No lip lesions, normal dentition and normal gums. ORAL CAVITY/OROPHARYNX:  Oral mucosa moist without lesion or asymmetry of the palate, tongue or posterior pharynx. Uvula somewhat elongated, makes contact with right tonsil. Right tonsil is 4+, left tonsil 3+, no cryptic debris or exudate. Superior pole of right tonsil is firm and larger than the rest of the tonsil. Oropharynx without erythema or exudate. NECK:  Supple without adenopathy or mass. THYROID:  No asymmetry, thyromegaly or nodule  palpated.  NEUROLOGIC:  No gross CN deficits. No nystagmus noted.   LUNGS:  Clear to auscultation HEART:  Regular rate and rhythm with no murmurs. ABD:  Soft, active EXT:  Symmetric, no config. NEURO:  Grossly intact and symmetric    Assessment/Plan . Snoring   (786.09) . Acute serous otitis media   (381.01) . Tonsillar hypertrophy   (474.11)   Based on history, patient has experienced recurrent sore throat with snoring and occasional problems with tonsiliths. Exam reveals tonsillar asymmetry with right tonsil larger than the left. The right tonsil is firm to palpation but is not concerning for malignancy such as lymphoma. Patient may certainly benefit for tonsillectomy for recurrent disease,  as well as uvulectomy to help her sleep disordered breathing. Recent sleep study did not reveal full OSA. Risks and benefits of surgery discussed along with post-operative management. All questions and concerns addressed. Consent obtained. For middle ear effusion, recommend frequent autoinflation, this should clear with time. Patient will look at her schedule and call back to schedule surgery.  Roxicet and Lortab liquid prescriptions written and given.  Advancement of diet and activity discussed.  Re check my office 1 week.   Flo Shanks T 03/03/2011, 4:51 PM

## 2011-03-06 NOTE — Anesthesia Preprocedure Evaluation (Signed)
Anesthesia Evaluation  Patient identified by MRN, date of birth, ID band Patient awake    Reviewed: Allergy & Precautions, H&P , NPO status , Patient's Chart, lab work & pertinent test results, reviewed documented beta blocker date and time   Airway Mallampati: II TM Distance: >3 FB Neck ROM: full    Dental   Pulmonary asthma ,          Cardiovascular neg cardio ROS     Neuro/Psych PSYCHIATRIC DISORDERS Negative Neurological ROS     GI/Hepatic Neg liver ROS, GERD-  Medicated and Controlled,  Endo/Other  Negative Endocrine ROS  Renal/GU negative Renal ROS  Genitourinary negative   Musculoskeletal   Abdominal   Peds  Hematology negative hematology ROS (+)   Anesthesia Other Findings See surgeon's H&P   Reproductive/Obstetrics negative OB ROS                           Anesthesia Physical Anesthesia Plan  ASA: II  Anesthesia Plan: General   Post-op Pain Management:    Induction: Intravenous  Airway Management Planned: Oral ETT  Additional Equipment:   Intra-op Plan:   Post-operative Plan: Extubation in OR  Informed Consent: I have reviewed the patients History and Physical, chart, labs and discussed the procedure including the risks, benefits and alternatives for the proposed anesthesia with the patient or authorized representative who has indicated his/her understanding and acceptance.     Plan Discussed with: CRNA and Surgeon  Anesthesia Plan Comments:         Anesthesia Quick Evaluation

## 2011-03-06 NOTE — Anesthesia Procedure Notes (Addendum)
Performed by: Signa Kell   Procedure Name: Intubation Date/Time: 03/06/2011 8:00 AM Performed by: Signa Kell Pre-anesthesia Checklist: Patient identified, Emergency Drugs available, Suction available and Patient being monitored Patient Re-evaluated:Patient Re-evaluated prior to inductionOxygen Delivery Method: Circle System Utilized Preoxygenation: Pre-oxygenation with 100% oxygen Intubation Type: IV induction Ventilation: Mask ventilation without difficulty Laryngoscope Size: Mac and 3 Tube type: Oral Tube size: 7.0 mm Number of attempts: 1 Airway Equipment and Method: stylet and oral airway Placement Confirmation: ETT inserted through vocal cords under direct vision,  positive ETCO2 and breath sounds checked- equal and bilateral Secured at: 21 cm Tube secured with: Tape Dental Injury: Teeth and Oropharynx as per pre-operative assessment

## 2011-03-06 NOTE — Transfer of Care (Signed)
Immediate Anesthesia Transfer of Care Note  Patient: [name] [name]  Procedure(s) Performed:  TONSILLECTOMY; UVULECTOMY  Patient Location: PACU  Anesthesia Type: General  Level of Consciousness: awake and sedated  Airway & Oxygen Therapy: Patient Spontanous Breathing and Patient connected to face mask oxygen  Post-op Assessment: Report given to PACU RN and Post -op Vital signs reviewed and stable  Post vital signs: Reviewed and stable  Complications: No apparent anesthesia complications

## 2011-03-06 NOTE — Op Note (Signed)
03/06/2011  9:04 AM    [name], [name] Relic  [id number]   Pre-Op Dx:  Chronic tonsillitis, uvular hypertrophy  Post-op Dx: same  Proc: Tonsillectomy, Uvulectomy, Adenoid ablation   Surg:  Flo Shanks T MD  Anes:  GOT  EBL:  min  Comp:  none  Findings:  3+ bulky tonsils.  Fleshy redundant pharyngeal mucosa.  Long thin uvula.  Mod prominent adenoids.  Congested anterior nose.  Procedure:  With the patient in a comfortable supine position,  general orotracheal anesthesia was induced without difficulty.  At an appropriate level, the patient was turned 90 away from anesthesia and placed in Trendelenburg.  A clean preparation and draping was accomplished.  Taking care to protect lips, teeth, and endotracheal tube, the Crowe-Davis mouth gag was introduced, expanded for visualization, and suspended from the Mayo stand in the standard fashion.  The findings were as described above.  Palate  retractor  and mirror were used to examine the nasopharynx with the findings as described above.   Anterior nose was examined with a nasal speculum with the findings as described above.  1/2% Xylocaine with 1:200,000 epinephrine, 10 cc's, was infiltrated into the peritonsillar planes and the base of the uvula on both sides for intraoperative hemostasis.  Several minutes were allowed for this to take effect.   Beginning on thE RIGHT side, the tonsil was grasped and retracted medially.  The mucosa over the anterior and superior poles was coagulated and then cut down to the capsule of the tonsil.  Using the cautery tip as a blunt dissector, the tonsil was dissected from its muscular fossa from anterior to posterior and from superior to inferior.  Fibrous bands were lysed as necessary.  Crossing vessels were coagulated as identified.  The tonsil was removed in its entirety as determined by examination of both tonsil and fossa.  A small additional quantity of cautery rendered the fossa hemostatic.    After  completing the 1st tonsillectomy, the mouth gag was re-positioned and LEFT was performed in identical fashion.  The tonsils were sent separately for pathologic analysis.  The palate tattoo was identified.  A vertical incision into the mucosa below the tattoo was performed.  The uvula was amputated, leaving a posterior mucosal flap.  This was brought up into the incision and secured there with interrupted4-0 Vicryl.  The posterior tonsillar pillar was brought forward to the anterior tonsillar pillar on both sides and secured at the superior tonsil fossa with Vicryl sutures.  After completing both tonsillectomies and rendering the oropharynx hemostatic, a ed rubber catheter was passed through the nose and out the mouth to serve as a palate retractor.  Using suction cautery and indirect visualization, the residual adenoids were  Ablated.  Upon achieving hemostasis in the nasopharynx, the oropharynx was again observed to be hemostatic.    At this point the palate retractor and mouthgag were relaxed for several minutes.  Upon reexpansion,  Hemostasis was observed.  An orogastric tube was briefly placed and a small amount of clear secretions was evacuated.  This tube was removed.  The palate retractor and mouth gag were relaxed and removed.  The dental status was intact.  At this point the procedure was completed.  The patient was returned to anesthesia, awakened, extubated, and transferred to recovery in stable condition.   Dispo:  OR to PACU.   Will observe for six hours, overnight if necessary and then discharged to home in care of family.  Plan:  Analgesia, hydration, limited activity  for two weeks.  Advance diet as comfortable.  Return to school or work at 10 days.  [name]  MD.

## 2011-03-06 NOTE — Anesthesia Postprocedure Evaluation (Signed)
Anesthesia Post Note  Patient: [name] [name]  Procedure(s) Performed:  TONSILLECTOMY; UVULECTOMY  Anesthesia type: General  Patient location: PACU  Post pain: Pain level controlled  Post assessment: Patient's Cardiovascular Status Stable  Last Vitals:  Filed Vitals:   03/06/11 1015  BP: 137/87  Pulse: 87  Temp:   Resp: 13    Post vital signs: Reviewed and stable  Level of consciousness: alert  Complications: No apparent anesthesia complications

## 2011-03-06 NOTE — Interval H&P Note (Signed)
History and Physical Interval Note:  03/06/2011 7:46 AM  [name] [name]  has presented today for surgery, with the diagnosis of tonsillar hypertrophy. The various methods of treatment have been discussed with the patient and family. After consideration of risks, benefits and other options for treatment, the patient has consented to  Procedure(s): TONSILLECTOMY UVULECTOMY as a surgical intervention .  The patients' history has been re-reviewed, patient re-examined, no change in status, stable for surgery.  I have reviewed the patients' chart and labs.  Questions were answered to the patient's satisfaction.     [name]

## 2011-03-07 ENCOUNTER — Encounter (HOSPITAL_BASED_OUTPATIENT_CLINIC_OR_DEPARTMENT_OTHER): Payer: Self-pay | Admitting: Otolaryngology

## 2011-03-26 LAB — OB RESULTS CONSOLE RPR: RPR: NONREACTIVE

## 2011-03-26 LAB — OB RESULTS CONSOLE HIV ANTIBODY (ROUTINE TESTING): HIV: NONREACTIVE

## 2011-03-26 LAB — OB RESULTS CONSOLE RUBELLA ANTIBODY, IGM: Rubella: IMMUNE

## 2011-10-11 ENCOUNTER — Encounter (HOSPITAL_COMMUNITY): Payer: Self-pay | Admitting: Pharmacist

## 2011-10-13 ENCOUNTER — Encounter (HOSPITAL_COMMUNITY): Payer: Self-pay

## 2011-10-16 ENCOUNTER — Other Ambulatory Visit: Payer: Self-pay | Admitting: Obstetrics

## 2011-10-16 ENCOUNTER — Encounter (HOSPITAL_COMMUNITY): Payer: Self-pay

## 2011-10-16 ENCOUNTER — Encounter (HOSPITAL_COMMUNITY)
Admission: RE | Admit: 2011-10-16 | Discharge: 2011-10-16 | Disposition: A | Discharge status: Home / Self Care | Payer: 59 | Source: Ambulatory Visit | Attending: Obstetrics | Admitting: Obstetrics

## 2011-10-16 LAB — CBC
HCT: 33.9 % — ABNORMAL LOW (ref 36.0–46.0)
Hemoglobin: 10.7 g/dL — ABNORMAL LOW (ref 12.0–15.0)
MCH: 27.8 pg (ref 26.0–34.0)
MCV: 88.1 fL (ref 78.0–100.0)
RBC: 3.85 MIL/uL — ABNORMAL LOW (ref 3.87–5.11)
WBC: 7.9 10*3/uL (ref 4.0–10.5)

## 2011-10-16 LAB — TYPE AND SCREEN: Antibody Screen: NEGATIVE

## 2011-10-16 NOTE — Patient Instructions (Signed)
Your procedure is scheduled on:10/23/11  Enter through the Main Entrance at :1130 am Pick up desk phone and dial [phone] and inform us of your arrival.  Please call [phone] if you have any problems the morning of surgery.  Remember: Do not eat after midnight:SUNDAY Do not drink after:9am  MONDAY  Take these meds the morning of surgery: Labetalol  DO NOT wear jewelry, eye make-up, lipstick,body lotion, or dark fingernail polish. Do not shave for 48 hours prior to surgery.  If you are to be admitted after surgery, leave suitcase in car until your room has been assigned. Patients discharged on the day of surgery will not be allowed to drive home.   Remember to use your Hibiclens as instructed.

## 2011-10-22 NOTE — H&P (Signed)
[name] [name] is a 36 y.o. G2P1 at 38'0  presenting for RCS. Pt notes no contractions. Good fetal movement, No vaginal bleeding, not leaking fluid.  P[facility] at [facility] since 7 wks, dated by 7 wk u/s - h/o prior c/s, w/ cervical, uterine and broad ligament tear. Recommendation of surgeon at that time to treat like a classical uterine incision and plan early delivery prior to labor.  - gestational htn. Pt w/ no prior h/o htn, bp's elevated at 33 wks, started meds at 35 wks and needing medication increase (labetolol 200 tid) at 37 wks. PIH labs have been neg w/ 135 mg protein and pt has remained asymptomatic - unplanned but desired pregnancy - depression, remained on Zoloft through the pregnancy - obesity, 17# wt gain  Meds: PNV, Zoloft 50 mg/ day, labetalol 200 mg tid NKDA  OB History    Grav Para Term Preterm Abortions TAB SAB Ect Mult Living   2 1        1     G1, failed IOL, posterior uterine tear w/ cervical and broad ligament tear at time of PCS  Past Medical History  Diagnosis Date  . Depression   . Anxiety    Past Surgical History  Procedure Date  . Cesarean section 10/08/2008  . Lasik   . Tonsillectomy 03/06/2011    Procedure: TONSILLECTOMY;  Surgeon: [name], MD;  Location: [facility];  Service: ENT;  Laterality: N/A;  . Uvulectomy 03/06/2011    Procedure: UVULECTOMY;  Surgeon: [name], MD;  Location: [facility];  Service: ENT;  Laterality: N/A;   Family History: family history is not on file. Social History:  reports that she has never smoked. She has never used smokeless tobacco. She reports that she does not drink alcohol or use illicit drugs.  Review of Systems - Negative except pregnant     Height 5\' 7"  (1.702 m), weight 99.791 kg (220 lb), last menstrual period 01/31/2011.  Physical Exam:  Gen: well appearing, no distress CV: RRR Pulm: CTAB Back: no CVAT Abd: gravid, NT, no RUQ pain LE: 1+ edema, equal  bilaterally, non-tender FH present  Prenatal labs: ABO, Rh: --/--/O POS (09/23 1402) Antibody: NEG (09/23 1402) Rubella:  immune RPR: NON REACTIVE (09/23 1402)  HBsAg: Negative (03/03 0000)  HIV: Non-reactive (03/03 0000)  GBS:    1 hr Glucola normal  Genetic screening nl NT, nl AFP Anatomy US nl u/s   Assessment/Plan: 36 y.o. G2P1 at 38'0  Prior c/s, classical type incision due to post uterine wall tear, plan RCS now Depression, monitor PP, cont SSRI Gest htn. No evidence PEC, follow bp post-op   Jaquae Rieves A. 10/22/2011, 8:51 PM

## 2011-10-23 ENCOUNTER — Encounter (HOSPITAL_COMMUNITY): Payer: Self-pay | Admitting: Anesthesiology

## 2011-10-23 ENCOUNTER — Encounter (HOSPITAL_COMMUNITY): Payer: Self-pay | Admitting: *Deleted

## 2011-10-23 ENCOUNTER — Encounter (HOSPITAL_COMMUNITY)
Admission: AD | Disposition: A | Discharge status: Home / Self Care | Payer: Self-pay | Source: Ambulatory Visit | Attending: Obstetrics

## 2011-10-23 ENCOUNTER — Inpatient Hospital Stay (HOSPITAL_COMMUNITY)
Admission: AD | Admit: 2011-10-23 | Discharge: 2011-10-26 | DRG: 765 | Disposition: A | Discharge status: Home / Self Care | Payer: 59 | Source: Ambulatory Visit | Attending: Obstetrics | Admitting: Obstetrics

## 2011-10-23 ENCOUNTER — Inpatient Hospital Stay (HOSPITAL_COMMUNITY): Payer: 59 | Admitting: Anesthesiology

## 2011-10-23 DIAGNOSIS — F3289 Other specified depressive episodes: Secondary | ICD-10-CM | POA: Diagnosis not present

## 2011-10-23 DIAGNOSIS — F53 Postpartum depression: Secondary | ICD-10-CM

## 2011-10-23 DIAGNOSIS — K219 Gastro-esophageal reflux disease without esophagitis: Secondary | ICD-10-CM

## 2011-10-23 DIAGNOSIS — J029 Acute pharyngitis, unspecified: Secondary | ICD-10-CM

## 2011-10-23 DIAGNOSIS — O34219 Maternal care for unspecified type scar from previous cesarean delivery: Principal | ICD-10-CM | POA: Diagnosis present

## 2011-10-23 DIAGNOSIS — Z98891 History of uterine scar from previous surgery: Secondary | ICD-10-CM

## 2011-10-23 DIAGNOSIS — O139 Gestational [pregnancy-induced] hypertension without significant proteinuria, unspecified trimester: Secondary | ICD-10-CM | POA: Diagnosis present

## 2011-10-23 DIAGNOSIS — J45991 Cough variant asthma: Secondary | ICD-10-CM

## 2011-10-23 DIAGNOSIS — D62 Acute posthemorrhagic anemia: Secondary | ICD-10-CM | POA: Diagnosis not present

## 2011-10-23 DIAGNOSIS — O9903 Anemia complicating the puerperium: Secondary | ICD-10-CM | POA: Diagnosis not present

## 2011-10-23 DIAGNOSIS — O99345 Other mental disorders complicating the puerperium: Secondary | ICD-10-CM | POA: Diagnosis not present

## 2011-10-23 DIAGNOSIS — F329 Major depressive disorder, single episode, unspecified: Secondary | ICD-10-CM

## 2011-10-23 HISTORY — DX: Gestational (pregnancy-induced) hypertension without significant proteinuria, unspecified trimester: O13.9

## 2011-10-23 HISTORY — DX: Acute posthemorrhagic anemia: D62

## 2011-10-23 HISTORY — DX: Other mental disorders complicating the puerperium: O99.345

## 2011-10-23 HISTORY — DX: History of uterine scar from previous surgery: Z98.891

## 2011-10-23 HISTORY — DX: Postpartum depression: F53.0

## 2011-10-23 SURGERY — Surgical Case
Anesthesia: Spinal | Site: Abdomen | Wound class: Clean Contaminated

## 2011-10-23 MED ORDER — KETOROLAC TROMETHAMINE 30 MG/ML IJ SOLN
INTRAMUSCULAR | Status: AC
  Administered 2011-10-23: 30 mg via INTRAVENOUS
  Filled 2011-10-23: qty 1

## 2011-10-23 MED ORDER — NALBUPHINE HCL 10 MG/ML IJ SOLN: 5.0000 mg | INTRAMUSCULAR | Status: DC | PRN

## 2011-10-23 MED ORDER — ONDANSETRON HCL 4 MG/2ML IJ SOLN
INTRAMUSCULAR | Status: DC | PRN
  Administered 2011-10-23: 4 mg via INTRAVENOUS

## 2011-10-23 MED ORDER — SODIUM CHLORIDE 0.9 % IV SOLN: 1.0000 ug/kg/h | INTRAVENOUS | Status: DC | PRN

## 2011-10-23 MED ORDER — FENTANYL CITRATE 0.05 MG/ML IJ SOLN: 25.0000 ug | INTRAMUSCULAR | Status: DC | PRN

## 2011-10-23 MED ORDER — LACTATED RINGERS IV SOLN
INTRAVENOUS | Status: DC | PRN
  Administered 2011-10-23: 14:00:00 via INTRAVENOUS

## 2011-10-23 MED ORDER — PHENYLEPHRINE 40 MCG/ML (10ML) SYRINGE FOR IV PUSH (FOR BLOOD PRESSURE SUPPORT)
PREFILLED_SYRINGE | INTRAVENOUS | Status: AC
  Filled 2011-10-23: qty 5

## 2011-10-23 MED ORDER — EPHEDRINE SULFATE 50 MG/ML IJ SOLN
INTRAMUSCULAR | Status: DC | PRN
  Administered 2011-10-23: 10 mg via INTRAVENOUS

## 2011-10-23 MED ORDER — IBUPROFEN 600 MG PO TABS
600.0000 mg | ORAL_TABLET | Freq: Four times a day (QID) | ORAL | Status: DC
  Administered 2011-10-24 – 2011-10-26 (×10): 600 mg via ORAL
  Filled 2011-10-23 (×9): qty 1

## 2011-10-23 MED ORDER — SCOPOLAMINE 1 MG/3DAYS TD PT72
MEDICATED_PATCH | TRANSDERMAL | Status: AC
  Administered 2011-10-23: 1.5 mg
  Filled 2011-10-23: qty 1

## 2011-10-23 MED ORDER — EPHEDRINE 5 MG/ML INJ
INTRAVENOUS | Status: AC
  Filled 2011-10-23: qty 10

## 2011-10-23 MED ORDER — FENTANYL CITRATE 0.05 MG/ML IJ SOLN
INTRAMUSCULAR | Status: AC
  Filled 2011-10-23: qty 2

## 2011-10-23 MED ORDER — SIMETHICONE 80 MG PO CHEW
80.0000 mg | CHEWABLE_TABLET | Freq: Three times a day (TID) | ORAL | Status: DC
  Administered 2011-10-23 – 2011-10-26 (×8): 80 mg via ORAL

## 2011-10-23 MED ORDER — MENTHOL 3 MG MT LOZG: 1.0000 | LOZENGE | OROMUCOSAL | Status: DC | PRN

## 2011-10-23 MED ORDER — OXYTOCIN 10 UNIT/ML IJ SOLN: 40.0000 [IU] | INTRAVENOUS | Status: DC | PRN

## 2011-10-23 MED ORDER — MEPERIDINE HCL 25 MG/ML IJ SOLN: 6.2500 mg | INTRAMUSCULAR | Status: DC | PRN

## 2011-10-23 MED ORDER — ONDANSETRON HCL 4 MG PO TABS: 4.0000 mg | ORAL_TABLET | ORAL | Status: DC | PRN

## 2011-10-23 MED ORDER — LACTATED RINGERS IV SOLN
INTRAVENOUS | Status: DC
  Administered 2011-10-23: 21:00:00 via INTRAVENOUS

## 2011-10-23 MED ORDER — PROMETHAZINE HCL 25 MG/ML IJ SOLN: 6.2500 mg | INTRAMUSCULAR | Status: DC | PRN

## 2011-10-23 MED ORDER — WITCH HAZEL-GLYCERIN EX PADS: 1.0000 "application " | MEDICATED_PAD | CUTANEOUS | Status: DC | PRN

## 2011-10-23 MED ORDER — SIMETHICONE 80 MG PO CHEW: 80.0000 mg | CHEWABLE_TABLET | ORAL | Status: DC | PRN

## 2011-10-23 MED ORDER — MIDAZOLAM HCL 2 MG/2ML IJ SOLN: 0.5000 mg | Freq: Once | INTRAMUSCULAR | Status: DC | PRN

## 2011-10-23 MED ORDER — ZOLPIDEM TARTRATE 5 MG PO TABS: 5.0000 mg | ORAL_TABLET | Freq: Every evening | ORAL | Status: DC | PRN

## 2011-10-23 MED ORDER — SODIUM CHLORIDE 0.9 % IJ SOLN: 3.0000 mL | INTRAMUSCULAR | Status: DC | PRN

## 2011-10-23 MED ORDER — LANOLIN HYDROUS EX OINT: 1.0000 "application " | TOPICAL_OINTMENT | CUTANEOUS | Status: DC | PRN

## 2011-10-23 MED ORDER — LACTATED RINGERS IV SOLN
INTRAVENOUS | Status: DC
  Administered 2011-10-23 (×2): via INTRAVENOUS

## 2011-10-23 MED ORDER — OXYTOCIN 40 UNITS IN LACTATED RINGERS INFUSION - SIMPLE MED: 62.5000 mL/h | INTRAVENOUS | Status: AC

## 2011-10-23 MED ORDER — MORPHINE SULFATE 0.5 MG/ML IJ SOLN
INTRAMUSCULAR | Status: AC
  Filled 2011-10-23: qty 10

## 2011-10-23 MED ORDER — SENNOSIDES-DOCUSATE SODIUM 8.6-50 MG PO TABS
2.0000 | ORAL_TABLET | Freq: Every day | ORAL | Status: DC
  Administered 2011-10-23 – 2011-10-25 (×3): 2 via ORAL

## 2011-10-23 MED ORDER — SODIUM CHLORIDE 0.9 % IR SOLN
Status: DC | PRN
  Administered 2011-10-23: 1000 mL

## 2011-10-23 MED ORDER — ONDANSETRON HCL 4 MG/2ML IJ SOLN
INTRAMUSCULAR | Status: AC
  Filled 2011-10-23: qty 2

## 2011-10-23 MED ORDER — KETOROLAC TROMETHAMINE 30 MG/ML IJ SOLN
30.0000 mg | Freq: Four times a day (QID) | INTRAMUSCULAR | Status: AC | PRN
  Administered 2011-10-23: 30 mg via INTRAVENOUS

## 2011-10-23 MED ORDER — ONDANSETRON HCL 4 MG/2ML IJ SOLN: 4.0000 mg | Freq: Three times a day (TID) | INTRAMUSCULAR | Status: DC | PRN

## 2011-10-23 MED ORDER — CEFAZOLIN SODIUM-DEXTROSE 2-3 GM-% IV SOLR
2.0000 g | INTRAVENOUS | Status: AC
  Administered 2011-10-23: 2 g via INTRAVENOUS

## 2011-10-23 MED ORDER — DIBUCAINE 1 % RE OINT: 1.0000 "application " | TOPICAL_OINTMENT | RECTAL | Status: DC | PRN

## 2011-10-23 MED ORDER — ONDANSETRON HCL 4 MG/2ML IJ SOLN: 4.0000 mg | INTRAMUSCULAR | Status: DC | PRN

## 2011-10-23 MED ORDER — DIPHENHYDRAMINE HCL 25 MG PO CAPS: 25.0000 mg | ORAL_CAPSULE | ORAL | Status: DC | PRN

## 2011-10-23 MED ORDER — SCOPOLAMINE 1 MG/3DAYS TD PT72: 1.0000 | MEDICATED_PATCH | Freq: Once | TRANSDERMAL | Status: DC

## 2011-10-23 MED ORDER — PRENATAL MULTIVITAMIN CH
1.0000 | ORAL_TABLET | Freq: Every day | ORAL | Status: DC
  Administered 2011-10-24 – 2011-10-26 (×3): 1 via ORAL
  Filled 2011-10-23 (×3): qty 1

## 2011-10-23 MED ORDER — CEFAZOLIN SODIUM-DEXTROSE 2-3 GM-% IV SOLR
INTRAVENOUS | Status: AC
  Filled 2011-10-23: qty 50

## 2011-10-23 MED ORDER — OXYCODONE-ACETAMINOPHEN 5-325 MG PO TABS: 1.0000 | ORAL_TABLET | ORAL | Status: DC | PRN

## 2011-10-23 MED ORDER — OXYTOCIN 10 UNIT/ML IJ SOLN
40.0000 [IU] | INTRAVENOUS | Status: DC | PRN
  Administered 2011-10-23: 40 [IU] via INTRAVENOUS

## 2011-10-23 MED ORDER — SERTRALINE HCL 50 MG PO TABS
50.0000 mg | ORAL_TABLET | Freq: Every day | ORAL | Status: DC
  Administered 2011-10-23 – 2011-10-25 (×3): 50 mg via ORAL
  Filled 2011-10-23 (×4): qty 1

## 2011-10-23 MED ORDER — TETANUS-DIPHTH-ACELL PERTUSSIS 5-2.5-18.5 LF-MCG/0.5 IM SUSP
0.5000 mL | Freq: Once | INTRAMUSCULAR | Status: AC
  Administered 2011-10-26: 0.5 mL via INTRAMUSCULAR
  Filled 2011-10-23: qty 0.5

## 2011-10-23 MED ORDER — OXYTOCIN 10 UNIT/ML IJ SOLN
INTRAMUSCULAR | Status: AC
  Filled 2011-10-23: qty 4

## 2011-10-23 MED ORDER — KETOROLAC TROMETHAMINE 30 MG/ML IJ SOLN: 30.0000 mg | Freq: Four times a day (QID) | INTRAMUSCULAR | Status: AC | PRN

## 2011-10-23 MED ORDER — DIPHENHYDRAMINE HCL 50 MG/ML IJ SOLN: 12.5000 mg | INTRAMUSCULAR | Status: DC | PRN

## 2011-10-23 MED ORDER — NALOXONE HCL 0.4 MG/ML IJ SOLN
0.4000 mg | INTRAMUSCULAR | Status: DC | PRN
Start: 2011-10-23 — End: 2011-10-26

## 2011-10-23 MED ORDER — DIPHENHYDRAMINE HCL 50 MG/ML IJ SOLN: 25.0000 mg | INTRAMUSCULAR | Status: DC | PRN

## 2011-10-23 MED ORDER — METOCLOPRAMIDE HCL 5 MG/ML IJ SOLN: 10.0000 mg | Freq: Three times a day (TID) | INTRAMUSCULAR | Status: DC | PRN

## 2011-10-23 MED ORDER — ACETAMINOPHEN 10 MG/ML IV SOLN: 1000.0000 mg | Freq: Four times a day (QID) | INTRAVENOUS | Status: AC | PRN

## 2011-10-23 MED ORDER — DIPHENHYDRAMINE HCL 25 MG PO CAPS: 25.0000 mg | ORAL_CAPSULE | Freq: Four times a day (QID) | ORAL | Status: DC | PRN

## 2011-10-23 SURGICAL SUPPLY — 38 items
CLOTH BEACON ORANGE TIMEOUT ST (SAFETY) ×2 IMPLANT
CONTAINER PREFILL 10% NBF 15ML (MISCELLANEOUS) IMPLANT
DRAPE SURG 17X23 STRL (DRAPES) ×2 IMPLANT
DRESSING TELFA 8X3 (GAUZE/BANDAGES/DRESSINGS) ×1 IMPLANT
DRSG COVADERM 4X10 (GAUZE/BANDAGES/DRESSINGS) ×1 IMPLANT
DURAPREP 26ML APPLICATOR (WOUND CARE) ×2 IMPLANT
ELECT REM PT RETURN 9FT ADLT (ELECTROSURGICAL) ×2
ELECTRODE REM PT RTRN 9FT ADLT (ELECTROSURGICAL) ×1 IMPLANT
EXTRACTOR VACUUM KIWI (MISCELLANEOUS) IMPLANT
EXTRACTOR VACUUM M CUP 4 TUBE (SUCTIONS) IMPLANT
GAUZE SPONGE 4X4 12PLY STRL LF (GAUZE/BANDAGES/DRESSINGS) ×1 IMPLANT
GLOVE BIO SURGEON STRL SZ 6.5 (GLOVE) ×2 IMPLANT
GLOVE BIOGEL PI IND STRL 7.0 (GLOVE) ×2 IMPLANT
GLOVE BIOGEL PI INDICATOR 7.0 (GLOVE) ×2
GOWN PREVENTION PLUS LG XLONG (DISPOSABLE) ×6 IMPLANT
KIT ABG SYR 3ML LUER SLIP (SYRINGE) IMPLANT
NDL HYPO 25X5/8 SAFETYGLIDE (NEEDLE) IMPLANT
NEEDLE HYPO 25X5/8 SAFETYGLIDE (NEEDLE) IMPLANT
NS IRRIG 1000ML POUR BTL (IV SOLUTION) ×2 IMPLANT
PACK C SECTION WH (CUSTOM PROCEDURE TRAY) ×2 IMPLANT
PAD ABD 7.5X8 STRL (GAUZE/BANDAGES/DRESSINGS) ×1 IMPLANT
PAD OB MATERNITY 4.3X12.25 (PERSONAL CARE ITEMS) ×1 IMPLANT
SLEEVE SCD COMPRESS KNEE MED (MISCELLANEOUS) IMPLANT
STAPLER VISISTAT 35W (STAPLE) IMPLANT
STRIP CLOSURE SKIN 1/2X4 (GAUZE/BANDAGES/DRESSINGS) IMPLANT
SUT MON AB 4-0 PS1 27 (SUTURE) ×1 IMPLANT
SUT PLAIN 0 NONE (SUTURE) IMPLANT
SUT PLAIN 2 0 XLH (SUTURE) ×1 IMPLANT
SUT VIC AB 0 CT1 36 (SUTURE) ×2 IMPLANT
SUT VIC AB 0 CTX 36 (SUTURE) ×6
SUT VIC AB 0 CTX36XBRD ANBCTRL (SUTURE) ×3 IMPLANT
SUT VIC AB 2-0 CT1 (SUTURE) ×1 IMPLANT
SUT VIC AB 2-0 CT1 27 (SUTURE) ×4
SUT VIC AB 2-0 CT1 TAPERPNT 27 (SUTURE) ×1 IMPLANT
TAPE PAPER 2X10 WHT MICROPORE (GAUZE/BANDAGES/DRESSINGS) ×1 IMPLANT
TOWEL OR 17X24 6PK STRL BLUE (TOWEL DISPOSABLE) ×4 IMPLANT
TRAY FOLEY CATH 14FR (SET/KITS/TRAYS/PACK) ×1 IMPLANT
WATER STERILE IRR 1000ML POUR (IV SOLUTION) ×2 IMPLANT

## 2011-10-23 NOTE — Op Note (Signed)
10/23/2011  3:03 PM  PATIENT:  [name] [name]  36 y.o. female  PRE-OPERATIVE DIAGNOSIS:  Previous Cesarean Section, Prior uterine surgery  POST-OPERATIVE DIAGNOSIS:  previous cesarean section/prior uterine surgery  PROCEDURE:  Procedure(s) (LRB) with comments: CESAREAN SECTION (N/A) - repeat cesarean section with delivery of baby girl at 58. Apgars 8/8., vacuum extraction  SURGEON:  Surgeon(s) and Role:    * Robley Fries, MD - Assisting    * Bridgett Hattabaugh A. Ernestina Penna, MD - Primary  PHYSICIAN ASSISTANT:   ASSISTANTS: mody, MD   ANESTHESIA:   spinal  EBL:  Total I/O In: 2200 [I.V.:2200] Out: 800 [Urine:200; Blood:600]  BLOOD ADMINISTERED:none  DRAINS: Urinary Catheter (Foley)   LOCAL MEDICATIONS USED:  NONE  SPECIMEN:  No Specimen  DISPOSITION OF SPECIMEN:  N/A  COUNTS:  YES  TOURNIQUET:  * No tourniquets in log *  DICTATION: .Note written in EPIC  PLAN OF CARE: Admit to inpatient   PATIENT DISPOSITION:  PACU - hemodynamically stable.   Delay start of Pharmacological VTE agent (>24hrs) due to surgical blood loss or risk of bleeding: yes   Indications: prior classical type uterine extension, prior c/s  Pre-operative Diagnosis: Previous Cesarean Section, Prior uterine surgery   Post-operative Diagnosis: previous cesarean section/prior uterine surgery  Procedure: CESAREAN SECTION  Anesthesiologist: Jiles Garter, MD    Findings:  @BABYSEXEBC @ infant,   Wt pending, apgar 8/8, clear amniotic fluid, nl uterus, tubes, ovaries, nl placenta, 3-VC EBL: 600 cc Antibiotics:  2g Ancef  Complications: none  Indications: This is a 36 y.o. year-old, G2P1  At 17'0 d admitted for RCS. Risks benefits and alternatives of the procedure were discussed with the patient who agreed to proceed  Procedure:  After informed consent was obtained the patient was taken to the operating room where spinal anesthesia was initiated.  She was prepped and draped in the normal  sterile fashion in dorsal supine position with a leftward tilt.  A foley catheter was inserted sterilely into the bladder . Gloves were changes and attention was turned to the patient's abdomen. A Pfannenstiel skin incision was made 2 cm above the pubic symphysis in the midline with the scalpel.  Dissection was carried down with the Bovie cautery until the fascia was reached. The fascia was incised in the midline. The incision was extended laterally with the Mayo scissors. The inferior aspect of the fascial incision was grasped with the Coker clamps, elevated up and the underlying rectus muscles were dissected off sharply. The superior aspect of the fascial incision was grasped with the Coker clamps elevated up and the underlying rectus muscles were dissected off sharply.  The peritoneum was entered during the fascial dissection. The peritoneal incision was extended superiorly and inferiorly with good visualization of the bladder. The bladder blade was inserted, the vesicouterine peritoneum was identified grasped with the pickups and entered sharply. The bladder flap was created digitally the bladder blade was reinserted. Palpation was done to assess the fetal position and the location of the uterine vessels. The lower segment of the uterus was incised sharply with the scalpel and extended superiorly and laterally with the bandage scissors. The amniotic sac was ruptures, copious clear amniotic fluid was noted. The infant occiput and grasped and rotated but the vertex was not delivered with fundal pressure as the head began to extend. A vacuum was placed on the occiput and the head then delivered easily with fundal pressure. Vacuum time was about 10 seconds. Vacuum was removed and shoulders followed  easily. Spontaneous cry noted.  The nose and mouth were bulb suctioned. The cord was clamped and cut. The infant was handed off to the waiting pediatrician. The placenta was expressed. The uterus was exteriorized. The  uterus was cleared of all clots and debris. The uterine incision was repaired with 0 Vicryl in a running locked fashion.  A second layer of the same suture was used in an imbricating fashion to obtain excellent hemostasis. Several additional figure of 8 sutures were used in the midline, below the incision. Some posterior omental adhesions were taken down with the Bovie. Hemostasis was assured. The uterus was then returned to the abdomen, the gutters were cleared of all clots and debris. The uterine incision was reinspected and found to be hemostatic. The peritoneum was grasped and closed with 2-0 Vicryl in a running fashion. The cut muscle edges and the underside of the fascia were inspected and found to be hemostatic. The fascia was closed with 0 Vicryl in two halves. The subcutaneous tissue was irrigated. Scarpa's layer was closed with a 2-0 plain gut suture. The skin was closed with a 4-0 Monocryl in a single layer. The patient tolerated the procedure well. Sponge lap and needle counts were correct x3 and patient was taken to the recovery room in a stable condition.  Taira Knabe A. 10/23/2011 3:04 PM

## 2011-10-23 NOTE — Anesthesia Postprocedure Evaluation (Signed)
  Anesthesia Post-op Note  Patient: [name] [name]  Procedure(s) Performed: Procedure(s) (LRB) with comments: CESAREAN SECTION (N/A) - repeat cesarean section with delivery of baby girl at 1410. Apgars 8/8.   Patient is awake, responsive, moving her legs, and has signs of resolution of her numbness. Pain and nausea are reasonably well controlled. Vital signs are stable and clinically acceptable. Oxygen saturation is clinically acceptable. There are no apparent anesthetic complications at this time. Patient is ready for discharge.

## 2011-10-23 NOTE — Brief Op Note (Signed)
10/23/2011  3:03 PM  PATIENT:  [name] [name]  36 y.o. female  PRE-OPERATIVE DIAGNOSIS:  Previous Cesarean Section, Prior uterine surgery  POST-OPERATIVE DIAGNOSIS:  previous cesarean section/prior uterine surgery  PROCEDURE:  Procedure(s) (LRB) with comments: CESAREAN SECTION (N/A) - repeat cesarean section with delivery of baby girl at 2. Apgars 8/8.  SURGEON:  Surgeon(s) and Role:    * Robley Fries, MD - Assisting    Tresa Endo A. Ernestina Penna, MD - Primary  PHYSICIAN ASSISTANT:   ASSISTANTS: mody, MD   ANESTHESIA:   spinal  EBL:  Total I/O In: 2200 [I.V.:2200] Out: 800 [Urine:200; Blood:600]  BLOOD ADMINISTERED:none  DRAINS: Urinary Catheter (Foley)   LOCAL MEDICATIONS USED:  NONE  SPECIMEN:  No Specimen  DISPOSITION OF SPECIMEN:  N/A  COUNTS:  YES  TOURNIQUET:  * No tourniquets in log *  DICTATION: .Note written in EPIC  PLAN OF CARE: Admit to inpatient   PATIENT DISPOSITION:  PACU - hemodynamically stable.   Delay start of Pharmacological VTE agent (>24hrs) due to surgical blood loss or risk of bleeding: yes

## 2011-10-23 NOTE — Transfer of Care (Signed)
Immediate Anesthesia Transfer of Care Note  Patient: [name] [name]  Procedure(s) Performed: Procedure(s) (LRB) with comments: CESAREAN SECTION (N/A) - repeat cesarean section with delivery of baby girl at 1410. Apgars 8/8.  Patient Location: PACU  Anesthesia Type: Spinal  Level of Consciousness: awake, alert , oriented and patient cooperative  Airway & Oxygen Therapy: Patient Spontanous Breathing  Post-op Assessment: Report given to PACU RN and Post -op Vital signs reviewed and stable  Post vital signs: Reviewed and stable  Complications: No apparent anesthesia complications

## 2011-10-23 NOTE — Anesthesia Procedure Notes (Signed)

## 2011-10-23 NOTE — Anesthesia Preprocedure Evaluation (Signed)
Anesthesia Evaluation  Patient identified by MRN, date of birth, ID band Patient awake    Reviewed: Allergy & Precautions, H&P , Patient's Chart, lab work & pertinent test results  Airway Mallampati: II TM Distance: >3 FB Neck ROM: full    Dental No notable dental hx.    Pulmonary neg pulmonary ROS, asthma ,  breath sounds clear to auscultation  Pulmonary exam normal       Cardiovascular negative cardio ROS  Rhythm:regular Rate:Normal     Neuro/Psych negative neurological ROS  negative psych ROS   GI/Hepatic negative GI ROS, Neg liver ROS,   Endo/Other  negative endocrine ROS  Renal/GU negative Renal ROS     Musculoskeletal   Abdominal   Peds  Hematology negative hematology ROS (+)   Anesthesia Other Findings Depression     Anxiety   Reproductive/Obstetrics (+) Pregnancy                           Anesthesia Physical Anesthesia Plan  ASA: II  Anesthesia Plan: Spinal   Post-op Pain Management:    Induction:   Airway Management Planned:   Additional Equipment:   Intra-op Plan:   Post-operative Plan:   Informed Consent: I have reviewed the patients History and Physical, chart, labs and discussed the procedure including the risks, benefits and alternatives for the proposed anesthesia with the patient or authorized representative who has indicated his/her understanding and acceptance.     Plan Discussed with:   Anesthesia Plan Comments:         Anesthesia Quick Evaluation

## 2011-10-23 NOTE — OR Nursing (Addendum)
Uterus massaged by S. Kesi Perrow RN. Two tubes of cord blood sent to lab.  3cc of blood evacuated from uterus during uterine massage. 

## 2011-10-24 ENCOUNTER — Encounter (HOSPITAL_COMMUNITY): Payer: Self-pay

## 2011-10-24 DIAGNOSIS — D62 Acute posthemorrhagic anemia: Secondary | ICD-10-CM

## 2011-10-24 DIAGNOSIS — F53 Postpartum depression: Secondary | ICD-10-CM | POA: Diagnosis present

## 2011-10-24 DIAGNOSIS — Z98891 History of uterine scar from previous surgery: Secondary | ICD-10-CM

## 2011-10-24 DIAGNOSIS — O99345 Other mental disorders complicating the puerperium: Secondary | ICD-10-CM

## 2011-10-24 DIAGNOSIS — O139 Gestational [pregnancy-induced] hypertension without significant proteinuria, unspecified trimester: Secondary | ICD-10-CM

## 2011-10-24 HISTORY — DX: Other mental disorders complicating the puerperium: O99.345

## 2011-10-24 HISTORY — DX: Gestational (pregnancy-induced) hypertension without significant proteinuria, unspecified trimester: O13.9

## 2011-10-24 HISTORY — DX: History of uterine scar from previous surgery: Z98.891

## 2011-10-24 HISTORY — DX: Acute posthemorrhagic anemia: D62

## 2011-10-24 HISTORY — DX: Postpartum depression: F53.0

## 2011-10-24 LAB — PREPARE RBC (CROSSMATCH)

## 2011-10-24 LAB — CBC
Hemoglobin: 8.7 g/dL — ABNORMAL LOW (ref 12.0–15.0)
RBC: 3.11 MIL/uL — ABNORMAL LOW (ref 3.87–5.11)
WBC: 8.5 10*3/uL (ref 4.0–10.5)

## 2011-10-24 NOTE — Progress Notes (Addendum)
Subjective: POD# 1 Information for the patient's newborn:  Jurica, Girl Militza [[id number]]  female  NICU admit, resp distress  Reports feeling well, ambulating well to NICU, denies PEC s/s. Feeding: breast, will start pumping Patient reports tolerating PO.  Breast symptoms: none Pain controlled with Motrin. Denies HA/SOB/C/P/N/V/dizziness. Flatus absent. She reports vaginal bleeding as normal, without clots.  She is ambulating, urinating without difficult.     Objective:   VS:  Filed Vitals:   10/23/11 2254 10/23/11 2300 10/24/11 0148 10/24/11 0546  BP: 142/91 156/92 113/77 131/90  Pulse:   64 68  Temp:   98.1 F (36.7 C) 97.9 F (36.6 C)  TempSrc:   Oral Oral  Resp:   18 18  Height:      Weight:      SpO2:   96% 99%     Intake/Output Summary (Last 24 hours) at 10/24/11 1023 Last data filed at 10/24/11 0800  Gross per 24 hour  Intake 5447.52 ml  Output   1950 ml  Net 3497.52 ml        Basename 10/24/11 0510  WBC 8.5  HGB 8.7*  HCT 27.0*  PLT 132*     Blood type: --/--/O POS (09/30 1146)  Rubella: Immune (03/03 0000)     Physical Exam:  General: alert, cooperative and no distress CV: Regular rate and rhythm Resp: clear Abdomen: soft, nontender, normal bowel sounds Incision: clean, dry, intact and dressing in place Uterine Fundus: firm, below umbilicus, nontender Lochia: minimal Ext: edema +2 pedal and pretibial and Homans sign is negative, no sign of DVT      Assessment/Plan: 36 y.o.   POD# 1. [id number]                  Active Problems:  S/P cesarean section / early at 37 6/7 wks, hx cervical/ligament tear w/ previous C/S (rpt, 9/30)  Postpartum care following cesarean delivery  Gestational hypertension  Depression complicating pregnancy, postpartum  Acute blood loss anemia  Doing well, stable.               Labile BP and dependent edema, will start HCTC 25 mg daily and continue I&O until DC, push PO fluids asymptomatic ABL anemia, plan  start oral Fe at DC  Depression stable on Zoloft Ambulate, warm PO fluids Routine post-op care  Darielys Giglia 10/24/2011, 10:23 AM

## 2011-10-24 NOTE — Progress Notes (Addendum)
Pt asking about diuretic that D. Renae Fickle, CNM discussed with her on rounds this morning.  Left VM on D. Paul's cell phone.  Called office and was told that she is not in office today.  Phoned Dr. Seymour Bars with BP and LE edema information.  No new order received, however Dr. Seymour Bars stated that if DBP consistently remains above 90 to phone her again and she will reevaluate need for diuretic.

## 2011-10-24 NOTE — Progress Notes (Signed)
UR Chart review completed.  

## 2011-10-24 NOTE — Anesthesia Postprocedure Evaluation (Signed)
  Anesthesia Post-op Note  Patient: [name] [name]  Procedure(s) Performed: Procedure(s) (LRB) with comments: CESAREAN SECTION (N/A) - repeat cesarean section with delivery of baby girl at 1410. Apgars 8/8.  Patient Location: [facility]  Anesthesia Type: Spinal  Level of Consciousness: awake and alert   Airway and Oxygen Therapy: Patient Spontanous Breathing  Post-op Pain: none  Post-op Assessment: Patient's Cardiovascular Status Stable, Respiratory Function Stable, Patent Airway, No signs of Nausea or vomiting, Adequate PO intake, Pain level controlled, No headache, No backache, No residual numbness and No residual motor weakness  Post-op Vital Signs: Reviewed and stable  Complications: No apparent anesthesia complications

## 2011-10-24 NOTE — Addendum Note (Signed)
Addendum  created 10/24/11 0809 by Suella Grove, CRNA   Modules edited:Notes Section

## 2011-10-24 NOTE — Addendum Note (Signed)
Addendum  created 10/24/11 0809 by Suella Grove, CRNA   Modules edited:Anesthesia Flowsheet, Notes Section

## 2011-10-24 NOTE — Progress Notes (Signed)
SW attempted to meet with MOB, but she was not in her room.  SW will attempt to complete assessment at a later time. 

## 2011-10-25 NOTE — Progress Notes (Signed)
Reinforced importance of baby sleeping in crib and not in bed with parent while parent is sleeping.

## 2011-10-25 NOTE — Progress Notes (Signed)
SW received consult to meet with parents due to critically ill baby in NICU.  Baby has been transferred back to [facility] and, therefore, has screened out referral.  SW notes a history of depression, but notes that MOB is currently taking an antidepressant.  SW will see MOB by her request or if issues arise. 

## 2011-10-25 NOTE — Progress Notes (Signed)
Pt notes no HA, no vision change and minimal lochia. Baby just out of NICU last night.  Filed Vitals:   10/24/11 1347 10/24/11 1800 10/24/11 2130 10/25/11 0546  BP: 132/81 156/95 125/83 141/91  Pulse: 72 74 81 81  Temp: 98.2 F (36.8 C) 97.4 F (36.3 C) 97.9 F (36.6 C) 97.9 F (36.6 C)  TempSrc: Oral Oral Oral Oral  Resp: 18 18 18 18   Height:      Weight:      SpO2: 100% 99% 99% 95%   Gen: well, no distress Mood: affect flatter than normal CV: RRR Pulm: CTAB Abd: obese, approp tender Inc: dressed LE: 1+ edema  CBC    Component Value Date/Time   WBC 8.5 10/24/2011 0510   RBC 3.11* 10/24/2011 0510   HGB 8.7* 10/24/2011 0510   HCT 27.0* 10/24/2011 0510   PLT 132* 10/24/2011 0510   MCV 86.8 10/24/2011 0510   MCH 28.0 10/24/2011 0510   MCHC 32.2 10/24/2011 0510   RDW 13.8 10/24/2011 0510     A/P: Post-op,  Recovering well, cont routine care Borderline bps, no labetalol since delivery, will cont to watch, possible pain and anxiety (baby in NICU) contributing to bp elevation, will watch o/n. No evidence PEC. Check PIH labs tomorrow Baby w/ wt loss, feeeding difficulty, out of NICU now [facility] A. 10/25/2011 11:53 AM

## 2011-10-26 LAB — COMPREHENSIVE METABOLIC PANEL
Albumin: 2.3 g/dL — ABNORMAL LOW (ref 3.5–5.2)
Alkaline Phosphatase: 111 U/L (ref 39–117)
BUN: 9 mg/dL (ref 6–23)
Chloride: 106 mEq/L (ref 96–112)
Glucose, Bld: 76 mg/dL (ref 70–99)
Potassium: 4.9 mEq/L (ref 3.5–5.1)
Total Bilirubin: 0.2 mg/dL — ABNORMAL LOW (ref 0.3–1.2)

## 2011-10-26 LAB — URIC ACID: Uric Acid, Serum: 5.8 mg/dL (ref 2.4–7.0)

## 2011-10-26 LAB — CBC
HCT: 30 % — ABNORMAL LOW (ref 36.0–46.0)
Hemoglobin: 9.4 g/dL — ABNORMAL LOW (ref 12.0–15.0)
RDW: 14 % (ref 11.5–15.5)
WBC: 7 10*3/uL (ref 4.0–10.5)

## 2011-10-26 MED ORDER — OXYCODONE-ACETAMINOPHEN 5-325 MG PO TABS: 1.0000 | ORAL_TABLET | Freq: Four times a day (QID) | ORAL | Status: DC | PRN

## 2011-10-26 MED ORDER — IBUPROFEN 600 MG PO TABS: 600.0000 mg | ORAL_TABLET | Freq: Four times a day (QID) | ORAL | Status: AC

## 2011-10-26 MED ORDER — FERRALET 90 90-1 MG PO TABS: 1.0000 | ORAL_TABLET | Freq: Every day | ORAL | Status: AC

## 2011-10-26 NOTE — Progress Notes (Signed)
Patient discharged home.  No needs noted.  Verbalized understanding of discharge instructions.

## 2011-10-26 NOTE — Progress Notes (Signed)
Patient ID: [name] [name], female   DOB: [birth year], 36 y.o.   MRN: [id number] POD # 3  Subjective: Pt reports feeling well and eager for d/c home/Denies symptoms of PEC Pain controlled with motrin Tolerating po/Voiding without problems/ No n/v/Flatus pos and pos BM Activity: out of bed and ambulate Bleeding is spotting Newborn info:  Information for the patient's newborn:  Poore, Girl Yvett [[id number]]  female Feeding: bottle   Objective: VS: Blood pressure 144/89, pulse 73, temperature 98.5 F (36.9 C), temperature source Oral, resp. rate 20, height 5\' 7"  (1.702 m), weight 104.327 kg (230 lb).   LABS:  Basename 10/26/11 0535 10/24/11 0510  WBC 7.0 8.5  HGB 9.4* 8.7*  HCT 30.0* 27.0*  PLT 146* 132*   Uric Acid: 5.8 AST/ALT: 15/8     Physical Exam:  General: alert, cooperative and no distress CV: Regular rate and rhythm Resp: clear Abdomen: soft, nontender, normal bowel sounds Incision: healing well, closed with subcuticular closure Uterine Fundus: firm, below umbilicus, nontender Lochia: minimal Ext: edema +2 to +3 pedal and pretib and Homans sign is negative, no sign of DVT    A/P: POD # 3/ G2P1002/ S/P C/Section d/t previous c/s and uterine surgery Hx gest HTN; BP's slightly labile; will review with MD and plan BP recheck in 1 week Mild ABL anemia Doing well and stable for discharge home RX's: Ibuprofen 600mg  po Q 6 hrs prn pain #30 Refill x 1 Percocet 5/325 1 - 2 tabs po every 6 hrs prn pain  #30 No refill Niferex 150mg  po QD x#30 ref x 1 Rt pp f/u in 6 weeks    Signed: Demetrius Revel, MSN, [facility] 10/26/2011, 10:37 AM

## 2011-10-27 LAB — TYPE AND SCREEN
ABO/RH(D): O POS
Antibody Screen: NEGATIVE
Unit division: 0

## 2011-11-15 NOTE — Discharge Summary (Signed)
Obstetric Discharge Summary Reason for Admission: Prior c/s, classical type incision due to post uterine wall tear, plan RCS now Prenatal Procedures: ultrasound Intrapartum Procedures: cesarean: low cervical, transverse Postpartum Procedures: none Complications-Operative and Postpartum: none Hemoglobin  Date Value Range Status  10/26/2011 9.4* 12.0 - 15.0 g/dL Final     HCT  Date Value Range Status  10/26/2011 30.0* 36.0 - 46.0 % Final    Physical Exam:  General: alert, cooperative and no distress Lochia: appropriate Uterine Fundus: firm Incision: healing well, and closed with subcuticular closure DVT Evaluation: No evidence of DVT seen on physical exam.  Discharge Diagnoses: Term Pregnancy-delivered by repeat c/s; gest HTN, stable; hx depression, stable; ABL Anemia, d/c to home on iron supplement. Infant to NICU after delivery d/t resp distress.  Stable on day 2 and d/c'ed to central nursery  Discharge Information: Date: 10/26/2011 Activity: pelvic rest Diet: routine Medications: PNV, Ibuprofen, Percocet Condition: stable Instructions: refer to practice specific booklet Discharge to: home Follow-up Information    Follow up with [facility] A[facility] MD. In 6 weeks.   Contact information:   [address] [place] [address] [phone]          Newborn Data: Live born female on 10/23/2011 Birth Weight: 7 lb 11.8 oz (3510 g) APGAR: 8, 8  Home with mother.  Cayleb Jarnigan K 11/15/2011, 1:42 PM

## 2012-06-10 ENCOUNTER — Telehealth: Payer: Self-pay | Admitting: Family Medicine

## 2012-06-10 NOTE — Telephone Encounter (Signed)
Patient Information:  Caller Name: Francis Dowse  Phone: [phone]  Patient: [name] [name], [name] [name]  Gender: Female  DOB: October 19, 1975  Age: 37 Years  PCP: Kelle Darting [facility])  Pregnant: No  Office Follow Up:  Does the office need to follow up with this patient?: Yes  Instructions For The Office: Rx request.  Thank you.  RN Note:  Advised caller I would forward request and to call back if symtoms present.  [facility] [place] [phone],  Symptoms  Reason For Call & Symptoms: Pt's son has scabies.  Francis Dowse calling for an RX for wife also. No sx.  Francis Dowse was advised to call have a RX for scabies as a precaution.  Reviewed Health History In EMR: No  Reviewed Medications In EMR: No  Reviewed Allergies In EMR: No  Reviewed Surgeries / Procedures: No  Date of Onset of Symptoms: 06/10/2012 OB / GYN:  LMP: Unknown  Guideline(s) Used:  No Protocol Available - Information Only  Disposition Per Guideline:   Discuss with PCP and Callback by Nurse Today  Reason For Disposition Reached:   Nursing judgment  Advice Given: Callback if symptoms develop  Patient Will Follow Care Advice:  YES

## 2013-11-24 ENCOUNTER — Encounter (HOSPITAL_COMMUNITY): Payer: Self-pay

## 2015-09-17 ENCOUNTER — Ambulatory Visit (INDEPENDENT_AMBULATORY_CARE_PROVIDER_SITE_OTHER): Payer: Managed Care, Other (non HMO) | Admitting: Podiatry

## 2015-09-17 ENCOUNTER — Encounter: Payer: Self-pay | Admitting: Podiatry

## 2015-09-17 VITALS — BP 138/85 | HR 82 | Ht 67.0 in | Wt 207.0 lb

## 2015-09-17 DIAGNOSIS — B070 Plantar wart: Secondary | ICD-10-CM | POA: Diagnosis not present

## 2015-09-17 DIAGNOSIS — B079 Viral wart, unspecified: Secondary | ICD-10-CM

## 2015-09-17 DIAGNOSIS — B078 Other viral warts: Secondary | ICD-10-CM

## 2015-09-17 NOTE — Patient Instructions (Signed)

## 2015-09-17 NOTE — Progress Notes (Signed)
   Subjective:    Patient ID: [name] [name], female    DOB: 11/19/1975, 40 y.o.   MRN: [id number]  HPI  Chief Complaint  Patient presents with  . painful lesion    R foot heel lateral side x 3 mo       Review of Systems  All other systems reviewed and are negative.      Objective:   Physical Exam        Assessment & Plan:

## 2015-09-17 NOTE — Progress Notes (Signed)
Subjective:     Patient ID: [name] [name], female   DOB: 12/20/1975, 40 y.o.   MRN: [id number]  HPI patient presents stating I have a very painful corn on the bottom of my right heel on the outside that I have tried using medication on and treat but not able to get it better   Review of Systems  All other systems reviewed and are negative.      Objective:   Physical Exam  Constitutional: She is oriented to person, place, and time.  Cardiovascular: Intact distal pulses.   Musculoskeletal: Normal range of motion.  Neurological: She is oriented to person, place, and time.  Skin: Skin is warm.  Nursing note and vitals reviewed.  neurovascular status intact muscle strength adequate range of motion within normal limits with patient found to have a keratotic lesion on the plantar aspect right heel lateral side that's painful when pressed with pain to lateral pressure. There is also dark spot indicating blood vessels and there is pinpoint bleeding upon debridement with patient found to have good digital perfusion and well oriented 3     Assessment:     Verruca plantaris plantar right approximate 2 cm in size    Plan:     H&P condition reviewed and recommended excision. Explained it could recur and all other complications and risk and today I infiltrated with 60 mg Xylocaine with epinephrine applied sterile prep to the area and then excised the lesion with sterile instrumentation. I then applied phenol to the base and sterile dressing and instructed on elevation and compression and reappoint and also sent for pathological evaluation

## 2015-11-11 ENCOUNTER — Ambulatory Visit (INDEPENDENT_AMBULATORY_CARE_PROVIDER_SITE_OTHER): Payer: Managed Care, Other (non HMO) | Admitting: Podiatry

## 2015-11-11 ENCOUNTER — Encounter: Payer: Self-pay | Admitting: Podiatry

## 2015-11-11 DIAGNOSIS — L600 Ingrowing nail: Secondary | ICD-10-CM

## 2015-11-11 NOTE — Progress Notes (Signed)
Subjective:     Patient ID: [name] [name], female   DOB: 01/31/1975, 40 y.o.   MRN: [id number]  HPI patient states the wart is doing well but I'm having a lot of problems with ingrown toenails on both big toes with the side towards my second toe on the right and the other side on the left towards my inside of my foot. States that she tries to trim them and goes and gets pedicures but that's not been effective   Review of Systems     Objective:   Physical Exam Neurovascular status intact crusted lesion right lateral heel which is doing well with incurvated nailbed right hallux lateral border left hallux medial border that are painful when pressed with distal redness but no active drainage    Assessment:     Healing well from verruca right with ingrown toenail deformity bilateral    Plan:     Reviewed conditions and recommended removal of the nail corners and explained procedure and risk. Patient wants these fixed understanding risk associated with ingrown toenail removal and recovery and today I infiltrated each hallux 60 mg I can Marcaine mixture remove the lateral border right hallux and applied chemical 3 agents 30 seconds followed by alcohol lavaged and on the left hallux remove the medial border exposed the matrix and applied phenol 3 applications 30 seconds followed by alcohol lavage and sterile dressing. Gave instructions on soaks and reappoint

## 2015-11-11 NOTE — Patient Instructions (Signed)

## 2016-07-25 DIAGNOSIS — Z0000 Encounter for general adult medical examination without abnormal findings: Secondary | ICD-10-CM | POA: Diagnosis not present

## 2016-07-27 DIAGNOSIS — R399 Unspecified symptoms and signs involving the genitourinary system: Secondary | ICD-10-CM | POA: Diagnosis not present

## 2016-08-10 DIAGNOSIS — R399 Unspecified symptoms and signs involving the genitourinary system: Secondary | ICD-10-CM | POA: Diagnosis not present

## 2016-11-03 DIAGNOSIS — Z23 Encounter for immunization: Secondary | ICD-10-CM | POA: Diagnosis not present

## 2016-12-12 DIAGNOSIS — Z30433 Encounter for removal and reinsertion of intrauterine contraceptive device: Secondary | ICD-10-CM | POA: Diagnosis not present

## 2016-12-12 DIAGNOSIS — Z3200 Encounter for pregnancy test, result unknown: Secondary | ICD-10-CM | POA: Diagnosis not present

## 2016-12-22 DIAGNOSIS — Z713 Dietary counseling and surveillance: Secondary | ICD-10-CM | POA: Diagnosis not present

## 2017-01-05 DIAGNOSIS — Z713 Dietary counseling and surveillance: Secondary | ICD-10-CM | POA: Diagnosis not present

## 2017-01-10 DIAGNOSIS — Z30431 Encounter for routine checking of intrauterine contraceptive device: Secondary | ICD-10-CM | POA: Diagnosis not present

## 2017-01-10 DIAGNOSIS — N760 Acute vaginitis: Secondary | ICD-10-CM | POA: Diagnosis not present

## 2017-01-10 DIAGNOSIS — N898 Other specified noninflammatory disorders of vagina: Secondary | ICD-10-CM | POA: Diagnosis not present

## 2017-03-02 DIAGNOSIS — Z713 Dietary counseling and surveillance: Secondary | ICD-10-CM | POA: Diagnosis not present

## 2017-03-12 DIAGNOSIS — F411 Generalized anxiety disorder: Secondary | ICD-10-CM | POA: Diagnosis not present

## 2017-03-16 DIAGNOSIS — F411 Generalized anxiety disorder: Secondary | ICD-10-CM | POA: Diagnosis not present

## 2017-03-30 DIAGNOSIS — F411 Generalized anxiety disorder: Secondary | ICD-10-CM | POA: Diagnosis not present

## 2017-04-13 DIAGNOSIS — F411 Generalized anxiety disorder: Secondary | ICD-10-CM | POA: Diagnosis not present

## 2017-05-01 DIAGNOSIS — Z01419 Encounter for gynecological examination (general) (routine) without abnormal findings: Secondary | ICD-10-CM | POA: Diagnosis not present

## 2017-05-01 DIAGNOSIS — N898 Other specified noninflammatory disorders of vagina: Secondary | ICD-10-CM | POA: Diagnosis not present

## 2017-05-01 DIAGNOSIS — Z1151 Encounter for screening for human papillomavirus (HPV): Secondary | ICD-10-CM | POA: Diagnosis not present

## 2017-05-01 DIAGNOSIS — R87610 Atypical squamous cells of undetermined significance on cytologic smear of cervix (ASC-US): Secondary | ICD-10-CM | POA: Diagnosis not present

## 2017-05-01 DIAGNOSIS — Z1231 Encounter for screening mammogram for malignant neoplasm of breast: Secondary | ICD-10-CM | POA: Diagnosis not present

## 2017-05-01 DIAGNOSIS — Z6834 Body mass index (BMI) 34.0-34.9, adult: Secondary | ICD-10-CM | POA: Diagnosis not present

## 2017-06-01 DIAGNOSIS — B977 Papillomavirus as the cause of diseases classified elsewhere: Secondary | ICD-10-CM | POA: Diagnosis not present

## 2017-06-01 DIAGNOSIS — N870 Mild cervical dysplasia: Secondary | ICD-10-CM | POA: Diagnosis not present

## 2017-06-01 DIAGNOSIS — Z3200 Encounter for pregnancy test, result unknown: Secondary | ICD-10-CM | POA: Diagnosis not present

## 2017-07-24 DIAGNOSIS — J40 Bronchitis, not specified as acute or chronic: Secondary | ICD-10-CM | POA: Diagnosis not present

## 2017-08-09 DIAGNOSIS — Z0000 Encounter for general adult medical examination without abnormal findings: Secondary | ICD-10-CM | POA: Diagnosis not present

## 2017-09-21 DIAGNOSIS — R5383 Other fatigue: Secondary | ICD-10-CM | POA: Diagnosis not present

## 2017-09-21 DIAGNOSIS — Z713 Dietary counseling and surveillance: Secondary | ICD-10-CM | POA: Diagnosis not present

## 2017-11-08 DIAGNOSIS — Z23 Encounter for immunization: Secondary | ICD-10-CM | POA: Diagnosis not present

## 2018-05-09 DIAGNOSIS — Z01419 Encounter for gynecological examination (general) (routine) without abnormal findings: Secondary | ICD-10-CM | POA: Diagnosis not present

## 2018-05-09 DIAGNOSIS — B977 Papillomavirus as the cause of diseases classified elsewhere: Secondary | ICD-10-CM | POA: Diagnosis not present

## 2018-05-09 DIAGNOSIS — Z6833 Body mass index (BMI) 33.0-33.9, adult: Secondary | ICD-10-CM | POA: Diagnosis not present

## 2018-05-09 DIAGNOSIS — Z118 Encounter for screening for other infectious and parasitic diseases: Secondary | ICD-10-CM | POA: Diagnosis not present

## 2018-05-09 DIAGNOSIS — Z124 Encounter for screening for malignant neoplasm of cervix: Secondary | ICD-10-CM | POA: Diagnosis not present

## 2018-05-09 DIAGNOSIS — Z1329 Encounter for screening for other suspected endocrine disorder: Secondary | ICD-10-CM | POA: Diagnosis not present

## 2018-05-09 DIAGNOSIS — Z130 Encounter for screening for diseases of the blood and blood-forming organs and certain disorders involving the immune mechanism: Secondary | ICD-10-CM | POA: Diagnosis not present

## 2018-05-09 DIAGNOSIS — Z131 Encounter for screening for diabetes mellitus: Secondary | ICD-10-CM | POA: Diagnosis not present

## 2018-05-09 DIAGNOSIS — Z8041 Family history of malignant neoplasm of ovary: Secondary | ICD-10-CM | POA: Diagnosis not present

## 2018-05-09 DIAGNOSIS — Z0000 Encounter for general adult medical examination without abnormal findings: Secondary | ICD-10-CM | POA: Diagnosis not present

## 2018-05-09 DIAGNOSIS — Z1151 Encounter for screening for human papillomavirus (HPV): Secondary | ICD-10-CM | POA: Diagnosis not present

## 2018-05-09 DIAGNOSIS — Z13220 Encounter for screening for lipoid disorders: Secondary | ICD-10-CM | POA: Diagnosis not present

## 2018-05-27 DIAGNOSIS — R87612 Low grade squamous intraepithelial lesion on cytologic smear of cervix (LGSIL): Secondary | ICD-10-CM | POA: Diagnosis not present

## 2018-05-27 DIAGNOSIS — Z3202 Encounter for pregnancy test, result negative: Secondary | ICD-10-CM | POA: Diagnosis not present

## 2018-05-27 DIAGNOSIS — B977 Papillomavirus as the cause of diseases classified elsewhere: Secondary | ICD-10-CM | POA: Diagnosis not present

## 2018-06-11 DIAGNOSIS — Z1231 Encounter for screening mammogram for malignant neoplasm of breast: Secondary | ICD-10-CM | POA: Diagnosis not present

## 2018-09-01 DIAGNOSIS — J029 Acute pharyngitis, unspecified: Secondary | ICD-10-CM | POA: Diagnosis not present

## 2018-10-16 DIAGNOSIS — L84 Corns and callosities: Secondary | ICD-10-CM | POA: Diagnosis not present

## 2018-10-30 DIAGNOSIS — Z23 Encounter for immunization: Secondary | ICD-10-CM | POA: Diagnosis not present

## 2018-11-05 DIAGNOSIS — Z00129 Encounter for routine child health examination without abnormal findings: Secondary | ICD-10-CM | POA: Diagnosis not present

## 2018-11-25 DIAGNOSIS — L84 Corns and callosities: Secondary | ICD-10-CM | POA: Diagnosis not present

## 2018-11-25 DIAGNOSIS — Z7689 Persons encountering health services in other specified circumstances: Secondary | ICD-10-CM | POA: Diagnosis not present

## 2018-11-25 DIAGNOSIS — R202 Paresthesia of skin: Secondary | ICD-10-CM | POA: Diagnosis not present

## 2018-11-25 DIAGNOSIS — L659 Nonscarring hair loss, unspecified: Secondary | ICD-10-CM | POA: Diagnosis not present

## 2018-11-28 DIAGNOSIS — R634 Abnormal weight loss: Secondary | ICD-10-CM | POA: Diagnosis not present

## 2018-11-28 DIAGNOSIS — L659 Nonscarring hair loss, unspecified: Secondary | ICD-10-CM | POA: Diagnosis not present

## 2018-12-26 DIAGNOSIS — L659 Nonscarring hair loss, unspecified: Secondary | ICD-10-CM | POA: Diagnosis not present

## 2019-01-02 DIAGNOSIS — L659 Nonscarring hair loss, unspecified: Secondary | ICD-10-CM | POA: Diagnosis not present

## 2019-01-08 DIAGNOSIS — L648 Other androgenic alopecia: Secondary | ICD-10-CM | POA: Diagnosis not present

## 2019-01-08 DIAGNOSIS — L650 Telogen effluvium: Secondary | ICD-10-CM | POA: Diagnosis not present

## 2019-04-23 DIAGNOSIS — Z23 Encounter for immunization: Secondary | ICD-10-CM | POA: Diagnosis not present

## 2019-05-01 DIAGNOSIS — L814 Other melanin hyperpigmentation: Secondary | ICD-10-CM | POA: Diagnosis not present

## 2019-05-01 DIAGNOSIS — D225 Melanocytic nevi of trunk: Secondary | ICD-10-CM | POA: Diagnosis not present

## 2019-05-01 DIAGNOSIS — L858 Other specified epidermal thickening: Secondary | ICD-10-CM | POA: Diagnosis not present

## 2019-05-01 DIAGNOSIS — R208 Other disturbances of skin sensation: Secondary | ICD-10-CM | POA: Diagnosis not present

## 2019-05-01 DIAGNOSIS — L918 Other hypertrophic disorders of the skin: Secondary | ICD-10-CM | POA: Diagnosis not present

## 2019-05-06 DIAGNOSIS — Z124 Encounter for screening for malignant neoplasm of cervix: Secondary | ICD-10-CM | POA: Diagnosis not present

## 2019-05-06 DIAGNOSIS — R87810 Cervical high risk human papillomavirus (HPV) DNA test positive: Secondary | ICD-10-CM | POA: Diagnosis not present

## 2019-05-06 DIAGNOSIS — Z01419 Encounter for gynecological examination (general) (routine) without abnormal findings: Secondary | ICD-10-CM | POA: Diagnosis not present

## 2019-05-06 DIAGNOSIS — Z1151 Encounter for screening for human papillomavirus (HPV): Secondary | ICD-10-CM | POA: Diagnosis not present

## 2019-05-06 DIAGNOSIS — T8332XA Displacement of intrauterine contraceptive device, initial encounter: Secondary | ICD-10-CM | POA: Diagnosis not present

## 2019-05-06 DIAGNOSIS — Z87410 Personal history of cervical dysplasia: Secondary | ICD-10-CM | POA: Diagnosis not present

## 2019-05-13 DIAGNOSIS — Z1231 Encounter for screening mammogram for malignant neoplasm of breast: Secondary | ICD-10-CM | POA: Diagnosis not present

## 2019-05-21 DIAGNOSIS — Z30430 Encounter for insertion of intrauterine contraceptive device: Secondary | ICD-10-CM | POA: Diagnosis not present

## 2019-05-21 DIAGNOSIS — Z975 Presence of (intrauterine) contraceptive device: Secondary | ICD-10-CM | POA: Diagnosis not present

## 2019-05-21 DIAGNOSIS — Z23 Encounter for immunization: Secondary | ICD-10-CM | POA: Diagnosis not present

## 2019-06-06 DIAGNOSIS — Z23 Encounter for immunization: Secondary | ICD-10-CM | POA: Diagnosis not present

## 2019-08-05 DIAGNOSIS — Z23 Encounter for immunization: Secondary | ICD-10-CM | POA: Diagnosis not present

## 2019-12-11 DIAGNOSIS — Z0000 Encounter for general adult medical examination without abnormal findings: Secondary | ICD-10-CM | POA: Diagnosis not present

## 2019-12-11 DIAGNOSIS — Z136 Encounter for screening for cardiovascular disorders: Secondary | ICD-10-CM | POA: Diagnosis not present
